# Patient Record
Sex: Male | Born: 1990 | Race: Black or African American | Hispanic: No | Marital: Married | Smoking: Never smoker
Health system: Southern US, Community
[De-identification: ages and names within clinical notes are randomized; demographics above are authoritative.]

## PROBLEM LIST (undated history)

## (undated) DIAGNOSIS — R569 Unspecified convulsions: Secondary | ICD-10-CM

## (undated) HISTORY — DX: Unspecified convulsions: R56.9

## (undated) HISTORY — PX: BRAIN SURGERY: SHX531

---
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
## 2014-12-20 ENCOUNTER — Ambulatory Visit (INDEPENDENT_AMBULATORY_CARE_PROVIDER_SITE_OTHER): Payer: Managed Care, Other (non HMO) | Admitting: Neurology

## 2014-12-20 ENCOUNTER — Telehealth: Payer: Self-pay | Admitting: *Deleted

## 2014-12-20 ENCOUNTER — Encounter: Payer: Self-pay | Admitting: Neurology

## 2014-12-20 VITALS — BP 138/87 | HR 68 | Temp 97.7°F | Ht 74.0 in | Wt 207.0 lb

## 2014-12-20 DIAGNOSIS — G40209 Localization-related (focal) (partial) symptomatic epilepsy and epileptic syndromes with complex partial seizures, not intractable, without status epilepticus: Secondary | ICD-10-CM | POA: Insufficient documentation

## 2014-12-20 DIAGNOSIS — I498 Other specified cardiac arrhythmias: Secondary | ICD-10-CM | POA: Diagnosis not present

## 2014-12-20 DIAGNOSIS — G40309 Generalized idiopathic epilepsy and epileptic syndromes, not intractable, without status epilepticus: Secondary | ICD-10-CM

## 2014-12-20 DIAGNOSIS — R569 Unspecified convulsions: Secondary | ICD-10-CM | POA: Diagnosis not present

## 2014-12-20 DIAGNOSIS — Z79899 Other long term (current) drug therapy: Secondary | ICD-10-CM | POA: Diagnosis not present

## 2014-12-20 DIAGNOSIS — IMO0002 Reserved for concepts with insufficient information to code with codable children: Secondary | ICD-10-CM | POA: Insufficient documentation

## 2014-12-20 DIAGNOSIS — G4452 New daily persistent headache (NDPH): Secondary | ICD-10-CM | POA: Diagnosis not present

## 2014-12-20 MED ORDER — LACOSAMIDE 150 MG PO TABS: 150.0000 mg | ORAL_TABLET | Freq: Two times a day (BID) | ORAL | Status: DC | PRN

## 2014-12-20 MED ORDER — AMITRIPTYLINE HCL 25 MG PO TABS: 25.0000 mg | ORAL_TABLET | Freq: Every day | ORAL | Status: DC

## 2014-12-20 NOTE — Patient Instructions (Addendum)
Overall you are doing fairly well but I do want to suggest a few things today:   Remember to drink plenty of fluid, eat healthy meals and do not skip any meals. Try to eat protein with a every meal and eat a healthy snack such as fruit or nuts in between meals. Try to keep a regular sleep-wake schedule and try to exercise daily, particularly in the form of walking, 20-30 minutes a day, if you can.   As far as your medications are concerned, I would like to suggest: Vimpat 150mg  twice daily, Amitriptyline 25mg  before bed  As far as diagnostic testing: MRI of the brain, EEG, labs, EKG before starting Amitriptyline  I would like to see you back in 4-6 months, sooner if we need to. Please call us with any interim questions, concerns, problems, updates or refill requests.   Please also call us for any test results so we can go over those with you on the phone.  My clinical assistant and will answer any of your questions and relay your messages to me and also relay most of my messages to you.   Our phone number is [phone]. We also have an after hours call service for urgent matters and there is a physician on-call for urgent questions. For any emergencies you know to call 911 or go to the nearest emergency room

## 2014-12-20 NOTE — Telephone Encounter (Signed)
Sent prescription to pt pharmacy at 12:05 pm.

## 2014-12-20 NOTE — Progress Notes (Signed)
[facility]    Provider:  Dr Lucia Gaskins Referring Provider: Maye Hides, PA Primary Care Physician:  Maye Hides, PA  CC:  Seizures  HPI:  Robert Byrd is a 24 y.o. male here as a referral from Dr. Hyacinth Meeker for seizure  Seizures started at age 7. Complex partial and GTCs. [facility] neurology as a child. Dr Hyacinth Meeker did an MRI of the brain last when he was [age]. Last EEG when he was 4. Seizures started with staring. He has an aura, starts to feel cold on the inside of his body, he tries to talk a lot, he "passes out". Mother provides most information. He starts salivating, starts moving his tongue around, he can't answer the simplest questions, he starts chewing a lot, staring off. Last GTCS was at age 55, denies missing dosages. He has been on tegretol all his life. Then they added Vimpat. Has had >3 events in the last month, woke up after urinating in the bed. Girlfriend said he had an episode of talking and saying strange things, afterwards is tired and has to sleep. Not missing doses. Previous to this, he has been controlled ok, maybe one seizure a month. Recently there was an error in his medications, he was on  vimpat bid and was changed to  bid and maybe that increased the seizures however was still having them once a month previously.   Also having daily pressure type headaches. Worsening. No weakness. No focal neurologic symptoms. Takes OTC medications daily.   Review of Systems: Patient complains of symptoms per HPI as well as the following symptoms: No CP, No SOB, No Palpitations, Headache, Seizures, No Depression. Pertinent negatives per HPI. All others negative.   History   Social History  . Marital Status: Single    Spouse Name: N/A  . Number of Children: 0  . Years of Education: Bachelor's   Occupational History  . [employer]    Social History Main Topics  . Smoking status: Never Smoker   . Smokeless tobacco: Not on file  . Alcohol  Use: No  . Drug Use: No  . Sexual Activity: Not on file   Other Topics Concern  . Not on file   Social History Narrative   Live at with home with girlfriend.   Right handed.   Caffeine use: Drinks soda occassionally     Family History  Problem Relation Age of Onset  . Hypertension Maternal Grandmother   . Diabetes Maternal Grandfather     Past Medical History  Diagnosis Date  . Seizure 1993    History reviewed. No pertinent past surgical history.  Current Outpatient Prescriptions  Medication Sig Dispense Refill  . albuterol (PROVENTIL HFA;VENTOLIN HFA) 108 (90 BASE) MCG/ACT inhaler Inhale 3 puffs into the lungs.    . carbamazepine (TEGRETOL XR) 200 MG 12 hr tablet Take 200 mg by mouth daily. 2 pill in the morning and 3 pills at night.    . Fluticasone-Salmeterol (ADVAIR DISKUS IN) Inhale 2 puffs into the lungs daily.    . Lacosamide 100 MG TABS Take 100 mg by mouth daily. 1 pill in the morning and 1 pill at night    . montelukast (SINGULAIR) 10 MG tablet Take 10 mg by mouth at bedtime.     No current facility-administered medications for this visit.    Allergies as of 12/20/2014  . (No Known Allergies)    Vitals: BP 138/87 mmHg  Pulse 68  Temp(Src) 97.7 F (36.5 C)  Ht 6\' 2"  (1.88 m)  Wt 207 lb (93.895 kg)  BMI 26.57 kg/m2 Last Weight:  Wt Readings from Last 1 Encounters:  12/20/14 207 lb (93.895 kg)   Last Height:   Ht Readings from Last 1 Encounters:  12/20/14 6\' 2"  (1.88 m)    Physical exam: Exam: Gen: NAD, conversant, well nourised, well groomed                     CV: RRR, no MRG. No Carotid Bruits. No peripheral edema, warm, nontender Eyes: Conjunctivae clear without exudates or hemorrhage  Neuro: Detailed Neurologic Exam  Speech:    Speech is normal; fluent and spontaneous with normal comprehension.  Cognition:    The patient is oriented to person, place, and time;     recent and remote memory intact;     language fluent;     normal  attention, concentration,     fund of knowledge Cranial Nerves:    The pupils are equal, round, and reactive to light. The fundi are normal and spontaneous venous pulsations are present. Visual fields are full to finger confrontation. Extraocular movements are intact. Trigeminal sensation is intact and the muscles of mastication are normal. The face is symmetric. The palate elevates in the midline. Hearing intact. Voice is normal. Shoulder shrug is normal. The tongue has normal motion without fasciculations.   Coordination:    Normal finger to nose and heel to shin. Normal rapid alternating movements.   Gait:    Heel-toe and tandem gait are normal.   Motor Observation:    No asymmetry, no atrophy, and no involuntary movements noted. Tone:    Normal muscle tone.    Posture:    Posture is normal. normal erect    Strength:    Strength is V/V in the upper and lower limbs.      Sensation: intact to LT     Reflex Exam:  DTR's:    Deep tendon reflexes in the upper and lower extremities are normal bilaterally.   Toes:    The toes are downgoing bilaterally.   Clonus:    Clonus is absent.      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 Daily tension type headaches with medication overuse. Neuro exam non focal. No MRi or eeg in 20+ years.  MRi of the brain w/wo contrast Labs today including levels EEG Start amitriptyline qhs EKG ordered Continue Tegretol Vimpat 150mg  bid  Naomie DeanAntonia Ericberto Padget, MD  [facility] [address] [place], [place] [id number]  Phone [phone] Fax [phone]

## 2014-12-21 ENCOUNTER — Telehealth: Payer: Self-pay | Admitting: *Deleted

## 2014-12-21 LAB — COMPREHENSIVE METABOLIC PANEL
A/G RATIO: 1.6 (ref 1.1–2.5)
ALT: 16 IU/L (ref 0–44)
AST: 19 IU/L (ref 0–40)
Albumin: 4.6 g/dL (ref 3.5–5.5)
Alkaline Phosphatase: 115 IU/L (ref 39–117)
BUN/Creatinine Ratio: 15 (ref 8–19)
BUN: 16 mg/dL (ref 6–20)
Bilirubin Total: <0.2 mg/dL (ref 0.0–1.2)
CALCIUM: 9.2 mg/dL (ref 8.7–10.2)
CO2: 26 mmol/L (ref 18–29)
CREATININE: 1.08 mg/dL (ref 0.76–1.27)
Chloride: 100 mmol/L (ref 97–108)
GFR calc Af Amer: 110 mL/min/{1.73_m2} (ref >59)
GFR, EST NON AFRICAN AMERICAN: 95 mL/min/{1.73_m2} (ref >59)
GLUCOSE: 85 mg/dL (ref 65–99)
Globulin, Total: 2.8 g/dL (ref 1.5–4.5)
Potassium: 4 mmol/L (ref 3.5–5.2)
SODIUM: 141 mmol/L (ref 134–144)
TOTAL PROTEIN: 7.4 g/dL (ref 6.0–8.5)

## 2014-12-21 LAB — CARBAMAZEPINE LEVEL, TOTAL

## 2014-12-21 LAB — VITAMIN D PNL(25-HYDRXY+1,25-DIHY)-BLD
VIT D 1 25 DIHYDROXY: 59.7 pg/mL (ref 19.9–79.3)
Vit D, 25-Hydroxy: 45.2 ng/mL (ref 30.0–100.0)

## 2014-12-21 LAB — B12 AND FOLATE PANEL
Folate: 6.9 ng/mL (ref >3.0)
VITAMIN B 12: 397 pg/mL (ref 211–946)

## 2014-12-21 NOTE — Telephone Encounter (Signed)
[facility] Rx for lacosamide (vimpat). She verbalized they received it and will be ready for pt to pick up today.

## 2014-12-22 LAB — CARBAMAZEPINE LEVEL, TOTAL: CARBAMAZEPINE LVL: 6.6 ug/mL (ref 4.0–12.0)

## 2014-12-22 LAB — LACOSAMIDE: LACOSAMIDE: 2.8 ug/mL — AB (ref 5.0–10.0)

## 2014-12-22 LAB — SPECIMEN STATUS REPORT

## 2014-12-25 ENCOUNTER — Telehealth: Payer: Self-pay | Admitting: *Deleted

## 2014-12-25 NOTE — Telephone Encounter (Signed)
Talked with patient about lab results per Dr. Lucia GaskinsAhern notes. Pt verbalized understanding.

## 2015-01-04 ENCOUNTER — Ambulatory Visit (INDEPENDENT_AMBULATORY_CARE_PROVIDER_SITE_OTHER): Payer: Managed Care, Other (non HMO)

## 2015-01-04 ENCOUNTER — Ambulatory Visit (INDEPENDENT_AMBULATORY_CARE_PROVIDER_SITE_OTHER): Payer: Managed Care, Other (non HMO) | Admitting: Neurology

## 2015-01-04 DIAGNOSIS — R569 Unspecified convulsions: Secondary | ICD-10-CM

## 2015-01-04 DIAGNOSIS — G4452 New daily persistent headache (NDPH): Secondary | ICD-10-CM | POA: Diagnosis not present

## 2015-01-04 MED ORDER — GADOPENTETATE DIMEGLUMINE 469.01 MG/ML IV SOLN: 20.0000 mL | Freq: Once | INTRAVENOUS | Status: AC | PRN

## 2015-01-04 NOTE — Procedures (Signed)
    History:  Robert Byrd is a 24 year old patient with a history of generalized tonic-clonic and complex partial seizures. The seizures have increased in frequency recently. The patient being evaluated for the ongoing seizures.  This is a routine EEG. No skull defects are noted. Medications include amitriptyline, carbamazepine, Vimpat, Advair, and Singulair.   EEG classification: Normal awake  Description of the recording: The background rhythms of this recording consists of a fairly well modulated medium amplitude alpha rhythm of 8 Hz that is reactive to eye opening and closure. As the record progresses, the patient appears to remain in the waking state throughout the recording. Photic stimulation was performed, resulting in a bilateral and symmetric photic driving response. Hyperventilation was also performed, resulting in a minimal buildup of the background rhythm activities without significant slowing seen. At no time during the recording does there appear to be evidence of spike or spike wave discharges or evidence of focal slowing. EKG monitor shows no evidence of cardiac rhythm abnormalities with a heart rate of 66.  Impression: This is a normal EEG recording in the waking state. No evidence of ictal or interictal discharges are seen.

## 2015-01-05 ENCOUNTER — Telehealth: Payer: Self-pay | Admitting: *Deleted

## 2015-01-05 NOTE — Telephone Encounter (Signed)
Talked with patient about normal EEG results. Patient verbalized understanding.

## 2015-01-11 ENCOUNTER — Telehealth: Payer: Self-pay | Admitting: Neurology

## 2015-01-11 NOTE — Telephone Encounter (Signed)
Called twice. No voice mail, sounded like something/someone picked up but no one on the line. 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

## 2015-01-11 NOTE — Telephone Encounter (Signed)
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

## 2015-01-17 ENCOUNTER — Telehealth: Payer: Self-pay | Admitting: *Deleted

## 2015-01-17 NOTE — Telephone Encounter (Signed)
Spoke with mother and made appointment for 01/25/15 at 11:15 am with pt and mother. Mother stated father cannot come because he will be working. I told her to arrive around 11:00am. Robert Byrd verbalized understanding.

## 2015-01-17 NOTE — Telephone Encounter (Signed)
Patient's mother is calling regarding the patient. His mother states she would like for her and patient's father to sit down and discuss patient's seizures. Please call and advise. Thank you.

## 2015-01-17 NOTE — Telephone Encounter (Signed)
Please schedule an appointment. Give me at least 30 minutes or make it before lunch or last appointment.

## 2015-01-25 ENCOUNTER — Ambulatory Visit: Payer: Self-pay | Admitting: Neurology

## 2015-02-21 ENCOUNTER — Other Ambulatory Visit: Payer: Self-pay | Admitting: Orthopaedic Surgery

## 2015-02-21 DIAGNOSIS — M47816 Spondylosis without myelopathy or radiculopathy, lumbar region: Secondary | ICD-10-CM

## 2015-02-23 ENCOUNTER — Ambulatory Visit
Admission: RE | Admit: 2015-02-23 | Discharge: 2015-02-23 | Disposition: A | Discharge status: Home / Self Care | Payer: 59 | Source: Ambulatory Visit | Attending: Orthopaedic Surgery | Admitting: Orthopaedic Surgery

## 2015-02-23 DIAGNOSIS — M47816 Spondylosis without myelopathy or radiculopathy, lumbar region: Secondary | ICD-10-CM

## 2015-03-15 ENCOUNTER — Emergency Department (HOSPITAL_BASED_OUTPATIENT_CLINIC_OR_DEPARTMENT_OTHER): Payer: Managed Care, Other (non HMO)

## 2015-03-15 ENCOUNTER — Encounter (HOSPITAL_BASED_OUTPATIENT_CLINIC_OR_DEPARTMENT_OTHER): Payer: Self-pay | Admitting: *Deleted

## 2015-03-15 ENCOUNTER — Emergency Department (HOSPITAL_BASED_OUTPATIENT_CLINIC_OR_DEPARTMENT_OTHER)
Admission: EM | Admit: 2015-03-15 | Discharge: 2015-03-15 | Disposition: A | Discharge status: Home / Self Care | Payer: Managed Care, Other (non HMO) | Attending: Emergency Medicine | Admitting: Emergency Medicine

## 2015-03-15 DIAGNOSIS — Z79899 Other long term (current) drug therapy: Secondary | ICD-10-CM | POA: Insufficient documentation

## 2015-03-15 DIAGNOSIS — Y998 Other external cause status: Secondary | ICD-10-CM | POA: Diagnosis not present

## 2015-03-15 DIAGNOSIS — G40909 Epilepsy, unspecified, not intractable, without status epilepticus: Secondary | ICD-10-CM | POA: Insufficient documentation

## 2015-03-15 DIAGNOSIS — S0990XA Unspecified injury of head, initial encounter: Secondary | ICD-10-CM

## 2015-03-15 DIAGNOSIS — W500XXA Accidental hit or strike by another person, initial encounter: Secondary | ICD-10-CM | POA: Diagnosis not present

## 2015-03-15 DIAGNOSIS — Y9367 Activity, basketball: Secondary | ICD-10-CM | POA: Insufficient documentation

## 2015-03-15 DIAGNOSIS — Y92310 Basketball court as the place of occurrence of the external cause: Secondary | ICD-10-CM | POA: Insufficient documentation

## 2015-03-15 MED ORDER — HYDROCODONE-ACETAMINOPHEN 5-325 MG PO TABS: 1.0000 | ORAL_TABLET | Freq: Four times a day (QID) | ORAL | Status: DC | PRN

## 2015-03-15 MED ORDER — HYDROCODONE-ACETAMINOPHEN 5-325 MG PO TABS
2.0000 | ORAL_TABLET | Freq: Once | ORAL | Status: AC
  Administered 2015-03-15: 2 via ORAL
  Filled 2015-03-15: qty 2

## 2015-03-15 NOTE — ED Notes (Signed)
States  Was elbowed side of head has a hx of seizures and thought he needed to be checked.  Alert and oriented color good skin warm and dry moves all extremeties denies loss of consciousness or other sx

## 2015-03-15 NOTE — ED Provider Notes (Signed)
CSN: [id number]     Arrival date & time 03/15/15  2019 History  This chart was scribed for Elwin Mocha, MD by Phillis Haggis, ED Scribe. This patient was seen in room MH01/MH01 and patient care was started at 8:41 PM.    Chief Complaint  Patient presents with  . Head Injury   Patient is a 24 y.o. male presenting with head injury. The history is provided by the patient. No language interpreter was used.  Head Injury Location:  R temporal Mechanism of injury: sports   Pain details:    Quality:  Sharp   Severity:  Moderate   Duration:  30 minutes   Timing:  Constant   Progression:  Worsening Chronicity:  New Ineffective treatments:  None tried Associated symptoms: headache   Associated symptoms: no blurred vision, no loss of consciousness, no nausea, no seizures and no vomiting     HPI Comments: Robert Byrd is a 24 y.o. male with a history of seizures who presents to the Emergency Department complaining of right temporal head injury onset 30 minutes ago. States that he was playing basketball when he was elbowed on the side of the head; reports some vision changes immediately after the incident but is not currently experiencing any. He denies nausea, vomiting, LOC, abnormal gait, dizziness, or any other other injuries. Mother states that in the past he has had seizures later on in the night after being hit in the head playing basketball. He reports that he is taking his seizure medications regularly and typically has one every six months since being regulated on medication by a neurologist in [place].  Past Medical History  Diagnosis Date  . Seizure 1993   History reviewed. No pertinent past surgical history. Family History  Problem Relation Age of Onset  . Hypertension Maternal Grandmother   . Diabetes Maternal Grandfather    History  Substance Use Topics  . Smoking status: Never Smoker   . Smokeless tobacco: Not on file  . Alcohol Use: No    Review of Systems  Eyes:  Negative for blurred vision.  Gastrointestinal: Negative for nausea and vomiting.  Musculoskeletal: Negative for gait problem.  Neurological: Positive for headaches. Negative for dizziness, seizures, loss of consciousness and syncope.  All other systems reviewed and are negative.  Allergies  Review of patient's allergies indicates no known allergies.  Home Medications   Prior to Admission medications   Medication Sig Start Date End Date Taking? Authorizing Provider  albuterol (PROVENTIL HFA;VENTOLIN HFA) 108 (90 BASE) MCG/ACT inhaler Inhale 3 puffs into the lungs.    Historical Provider, MD  amitriptyline (ELAVIL) 25 MG tablet Take 1 tablet (25 mg total) by mouth at bedtime. 12/20/14   Anson Fret, MD  carbamazepine (TEGRETOL XR) 200 MG 12 hr tablet Take 200 mg by mouth daily. 2 pill in the morning and 3 pills at night.    Historical Provider, MD  Fluticasone-Salmeterol (ADVAIR DISKUS IN) Inhale 2 puffs into the lungs daily.    Historical Provider, MD  Lacosamide 150 MG TABS Take 1 tablet (150 mg total) by mouth 2 (two) times daily as needed. 12/20/14   Anson Fret, MD  montelukast (SINGULAIR) 10 MG tablet Take 10 mg by mouth at bedtime.    Historical Provider, MD   BP 117/73 mmHg  Pulse 60  Temp(Src) 98.2 F (36.8 C) (Oral)  Resp 16  Ht  (1.88 m)  Wt 207 lb (93.895 kg)  BMI 26.57 kg/m2  SpO2 98% Physical  Exam  Constitutional: He is oriented to person, place, and time. He appears well-developed and well-nourished. No distress.  HENT:  Head: Normocephalic.    Mouth/Throat: No oropharyngeal exudate.  Eyes: EOM are normal. Pupils are equal, round, and reactive to light.  Neck: Normal range of motion. Neck supple.  Cardiovascular: Normal rate and regular rhythm.  Exam reveals no friction rub.   No murmur heard. Pulmonary/Chest: Effort normal and breath sounds normal. No respiratory distress. He has no wheezes. He has no rales.  Abdominal: He exhibits no distension. There  is no tenderness. There is no rebound.  Musculoskeletal: Normal range of motion. He exhibits no edema.  Neurological: He is alert and oriented to person, place, and time.  Skin: He is not diaphoretic.    ED Course  Procedures (including critical care time) Labs Review DIAGNOSTIC STUDIES: Oxygen Saturation is 98% on RA, normal by my interpretation.    COORDINATION OF CARE: 8:47 PM-Discussed treatment plan which includes CT scan and pain medication with pt at bedside and pt agreed to plan.   Labs Reviewed - No data to display  Imaging Review Ct Head Wo Contrast  03/15/2015   CLINICAL DATA:  Trauma, right sided head pain, headache  EXAM: CT HEAD WITHOUT CONTRAST  TECHNIQUE: Contiguous axial images were obtained from the base of the skull through the vertex without intravenous contrast.  COMPARISON:  MR brain 01/04/2015  FINDINGS: There is no evidence of mass effect, midline shift or extra-axial fluid collections. There is no evidence of a space-occupying lesion or intracranial hemorrhage. There is no evidence of a cortical-based area of acute infarction.  The ventricles and sulci are appropriate for the patient's age. The basal cisterns are patent.  Visualized portions of the orbits are unremarkable. The visualized portions of the paranasal sinuses and mastoid air cells are unremarkable.  The osseous structures are unremarkable.  IMPRESSION: Normal CT of the brain without intravenous contrast.   Electronically Signed   By: Elige KoHetal  Patel   On: 03/15/2015 21:11     EKG Interpretation None      MDM   Final diagnoses:  Head injury, initial encounter    24 year old male with history of seizures presents after a head injury will play basketball. He was elbowed in the right temple. History of seizures after head injuries previously. Did not have a seizure today. Neurologically intact here. No other injuries noted. No spinal tenderness. No extremity tenderness. We'll CT his head. CT normal.  Stable for discharge.  I personally performed the services described in this documentation, which was scribed in my presence. The recorded information has been reviewed and is accurate.     Elwin MochaBlair Jayana Kotula, MD 03/15/15 2120

## 2015-03-15 NOTE — ED Notes (Signed)
Elbowed in the right side of his head. No LOC. States he has a hx of seizures and felt he needed to be examined. He drove himself here.

## 2015-03-15 NOTE — Discharge Instructions (Signed)

## 2015-07-17 ENCOUNTER — Other Ambulatory Visit: Payer: Self-pay | Admitting: Neurology

## 2015-07-26 ENCOUNTER — Other Ambulatory Visit: Payer: Self-pay | Admitting: Neurology

## 2015-07-26 DIAGNOSIS — R569 Unspecified convulsions: Secondary | ICD-10-CM

## 2015-07-26 MED ORDER — LACOSAMIDE 150 MG PO TABS: 150.0000 mg | ORAL_TABLET | Freq: Two times a day (BID) | ORAL | Status: DC | PRN

## 2015-07-26 NOTE — Telephone Encounter (Signed)
[phone] called for refill request of Vimpat. Please fax to [phone]

## 2015-07-27 NOTE — Telephone Encounter (Signed)
Rx was signed and faxed   

## 2016-01-30 ENCOUNTER — Other Ambulatory Visit: Payer: Self-pay | Admitting: *Deleted

## 2016-01-30 ENCOUNTER — Encounter: Payer: Self-pay | Admitting: *Deleted

## 2016-01-30 DIAGNOSIS — R569 Unspecified convulsions: Secondary | ICD-10-CM

## 2016-01-30 MED ORDER — LACOSAMIDE 150 MG PO TABS: 150.0000 mg | ORAL_TABLET | Freq: Two times a day (BID) | ORAL | Status: DC | PRN

## 2016-01-30 NOTE — Progress Notes (Signed)
Faxed printed rx lacosamide to pt pharmacy for one month supply. Received confirmation. Pt has not been seen in over a year. He must schedule f/u. I wrote this on prescription sent to pharmacy. He cx f/u with Dr Lucia GaskinsAhern this month and stated he would call back to make another one at a later time.

## 2016-06-23 ENCOUNTER — Ambulatory Visit (INDEPENDENT_AMBULATORY_CARE_PROVIDER_SITE_OTHER): Payer: Managed Care, Other (non HMO) | Admitting: Nurse Practitioner

## 2016-06-23 ENCOUNTER — Encounter: Payer: Self-pay | Admitting: Nurse Practitioner

## 2016-06-23 ENCOUNTER — Telehealth: Payer: Self-pay | Admitting: Nurse Practitioner

## 2016-06-23 ENCOUNTER — Telehealth: Payer: Self-pay | Admitting: Neurology

## 2016-06-23 VITALS — BP 128/74 | HR 63 | Ht 74.0 in | Wt 197.4 lb

## 2016-06-23 DIAGNOSIS — G40909 Epilepsy, unspecified, not intractable, without status epilepticus: Secondary | ICD-10-CM | POA: Diagnosis not present

## 2016-06-23 DIAGNOSIS — G40209 Localization-related (focal) (partial) symptomatic epilepsy and epileptic syndromes with complex partial seizures, not intractable, without status epilepticus: Secondary | ICD-10-CM | POA: Diagnosis not present

## 2016-06-23 DIAGNOSIS — Z5181 Encounter for therapeutic drug level monitoring: Secondary | ICD-10-CM | POA: Diagnosis not present

## 2016-06-23 DIAGNOSIS — R569 Unspecified convulsions: Secondary | ICD-10-CM | POA: Diagnosis not present

## 2016-06-23 MED ORDER — LACOSAMIDE 150 MG PO TABS: 150.0000 mg | ORAL_TABLET | Freq: Two times a day (BID) | ORAL | 5 refills | Status: DC

## 2016-06-23 NOTE — Telephone Encounter (Signed)
Called and spoke to patient. He stated Andrey CampanileSandy C just called and spoke to him. Schedule f/u with CM.NP today at 1015 check in for 1030am appt. Needs refills on his medication.

## 2016-06-23 NOTE — Patient Instructions (Signed)
RX for Vimpat Get levels checked in 10 days Follow up in 6 months

## 2016-06-23 NOTE — Telephone Encounter (Signed)
error 

## 2016-06-23 NOTE — Telephone Encounter (Signed)
Pt requested appt for today.Advised he needs refill for vimpat. He said he thinks the dose needs to be increased. Dr A does not have availability until November. Pt last seen 4/16. Please call

## 2016-06-23 NOTE — Progress Notes (Signed)
[facility]  PATIENT: Robert Byrd DOB: 1991/04/24   REASON FOR VISIT: Seizure disorder HISTORY FROM: Patient    HISTORY OF PRESENT ILLNESS:UPDATE 10/09/2017CM [name], 25 year old male returns for follow-up he has a history of seizure disorder. He is currently on Tegretol 200 mg 2 in the morning and 3 at night along with Vimpat 150 twice daily. He ran out of his Vimpat Saturday morning and had a seizure Sunday  Afternoon while watching football. He has not missed doses of his Tegretol. He returns for reevaluation. He states he wants to have meds increased however the important thing is take the medication as directed and do not run out of medication. He returns for reevaluation   HISTORY AASeizures started at age 89. Complex partial and GTCs. [facility] Dr Hyacinth MeekerMiller did an MRI of the brain last when he was [age]. Last EEG when he was 4. Seizures started with staring. He has an aura, starts to feel cold on the inside of his body, he tries to talk a lot, he "passes out". Mother provides most information. He starts salivating, starts moving his tongue around, he can't answer the simplest questions, he starts chewing a lot, staring off. Last GTCS was at age 25, denies missing dosages. He has been on tegretol all his life. Then they added Vimpat. Has had >3 events in the last month, woke up after urinating in the bed. Girlfriend said he had an episode of talking and saying strange things, afterwards is tired and has to sleep. Not missing doses. Previous to this, he has been controlled ok, maybe one seizure a month. Recently there was an error in his medications, he was on 100mg  vimpat bid and was changed to 50mg  bid and maybe that increased the seizures however was still having them once a month previously.   Also having daily pressure type headaches. Worsening. No weakness. No focal neurologic symptoms. Takes OTC medications daily.   REVIEW OF SYSTEMS: Full 14  system review of systems performed and notable only for those listed, all others are neg:  Constitutional: neg  Cardiovascular: neg Ear/Nose/Throat: neg  Skin: neg Eyes: neg Respiratory: neg Gastroitestinal: neg  Hematology/Lymphatic: neg  Endocrine: neg Musculoskeletal:neg Allergy/Immunology: neg Neurological: Seizure disorder Psychiatric: neg Sleep : neg   ALLERGIES: No Known Allergies  HOME MEDICATIONS: Outpatient Medications Prior to Visit  Medication Sig Dispense Refill  . albuterol (PROVENTIL HFA;VENTOLIN HFA) 108 (90 BASE) MCG/ACT inhaler Inhale 3 puffs into the lungs.    Marland Kitchen. amitriptyline (ELAVIL) 25 MG tablet Take 1 tablet (25 mg total) by mouth at bedtime. 30 tablet 3  . carbamazepine (TEGRETOL XR) 200 MG 12 hr tablet Take 200 mg by mouth daily. 2 pill in the morning and 3 pills at night.    . Fluticasone-Salmeterol (ADVAIR DISKUS IN) Inhale 2 puffs into the lungs daily.    Marland Kitchen. HYDROcodone-acetaminophen (NORCO/VICODIN) 5-325 MG per tablet Take 1 tablet by mouth every 6 (six) hours as needed for moderate pain. 10 tablet 0  . Lacosamide 150 MG TABS Take 1 tablet (150 mg total) by mouth 2 (two) times daily as needed. (Patient taking differently: Take 150 mg by mouth 2 (two) times daily. ) 60 tablet 0  . montelukast (SINGULAIR) 10 MG tablet Take 10 mg by mouth at bedtime.     No facility-administered medications prior to visit.     PAST MEDICAL HISTORY: Past Medical History:  Diagnosis Date  . Seizure (HCC) 1993    PAST  SURGICAL HISTORY: History reviewed. No pertinent surgical history.  FAMILY HISTORY: Family History  Problem Relation Age of Onset  . Hypertension Maternal Grandmother   . Diabetes Maternal Grandfather     SOCIAL HISTORY: Social History   Social History  . Marital status: Single    Spouse name: N/A  . Number of children: 0  . Years of education: Bachelor's   Occupational History  . [employer]    Social History Main Topics  . Smoking  status: Never Smoker  . Smokeless tobacco: Never Used  . Alcohol use No  . Drug use: No  . Sexual activity: Not on file   Other Topics Concern  . Not on file   Social History Narrative   Live at with home with girlfriend.   Right handed.   Caffeine use: Drinks soda occassionally      PHYSICAL EXAM  Vitals:   06/23/16 1031  BP: 128/74  Pulse: 63  Weight: 197 lb 6.4 oz (89.5 kg)  Height: 6\' 2"  (1.88 m)   Body mass index is 25.34 kg/m.  Generalized: Well developed, in no acute distress  Head: normocephalic and atraumatic,. Oropharynx benign  Neck: Supple, no carotid bruits  Cardiac: Regular rate rhythm, no murmur  Musculoskeletal: No deformity   Neurological examination   Mentation: Alert oriented to time, place, history taking. Attention span and concentration appropriate. Recent and remote memory intact.  Follows all commands speech and language fluent.   Cranial nerve II-XII: Pupils were equal round reactive to light extraocular movements were full, visual field were full on confrontational test. Facial sensation and strength were normal. hearing was intact to finger rubbing bilaterally. Uvula tongue midline. head turning and shoulder shrug were normal and symmetric.Tongue protrusion into cheek strength was normal. Motor: normal bulk and tone, full strength in the BUE, BLE, fine finger movements normal, no pronator drift. No focal weakness Sensory: normal and symmetric to light touch,  Coordination: finger-nose-finger, heel-to-shin bilaterally, no dysmetria Reflexes: Brachioradialis 2/2, biceps 2/2, triceps 2/2, patellar 2/2, Achilles 2/2, plantar responses were flexor bilaterally. Gait and Station: Rising up from seated position without assistance, normal stance,  moderate stride, good arm swing, smooth turning, able to perform tiptoe, and heel walking without difficulty. Tandem gait is steady  DIAGNOSTIC DATA (LABS, IMAGING, TESTING) - I reviewed patient records, labs,  notes, testing and imaging myself where available.      Component Value Date/Time   NA 141 12/20/2014 1136   K 4.0 12/20/2014 1136   CL 100 12/20/2014 1136   CO2 26 12/20/2014 1136   GLUCOSE 85 12/20/2014 1136   BUN 16 12/20/2014 1136   CREATININE 1.08 12/20/2014 1136   CALCIUM 9.2 12/20/2014 1136   PROT 7.4 12/20/2014 1136   ALBUMIN 4.6 12/20/2014 1136   AST 19 12/20/2014 1136   ALT 16 12/20/2014 1136   ALKPHOS 115 12/20/2014 1136   BILITOT <0.2 12/20/2014 1136   GFRNONAA 95 12/20/2014 1136   GFRAA 110 12/20/2014 1136    ASSESSMENT AND PLAN  25 y.o. year old male  has a past medical history of Seizure (HCC) (1993). here to follow-up. He had a seizure on Sunday after missing Vimpat dose on Saturday because he ran out of medication. He continues to take his Tegretol.   PLAN: RX for Vimpat given do not run out of medication Continue Tegretol at current dose Get levels checked in 10 days for both Tegretol and Vimpat Follow up in 6 months Nilda Riggs, GNP, [facility],  APRN  [facility] [address], [place], [place] [address] [phone]

## 2016-07-03 ENCOUNTER — Ambulatory Visit: Payer: Managed Care, Other (non HMO) | Admitting: Nurse Practitioner

## 2016-07-08 ENCOUNTER — Other Ambulatory Visit (INDEPENDENT_AMBULATORY_CARE_PROVIDER_SITE_OTHER): Payer: Self-pay

## 2016-07-08 DIAGNOSIS — G40909 Epilepsy, unspecified, not intractable, without status epilepticus: Secondary | ICD-10-CM

## 2016-07-08 DIAGNOSIS — Z5181 Encounter for therapeutic drug level monitoring: Secondary | ICD-10-CM

## 2016-07-08 DIAGNOSIS — G40209 Localization-related (focal) (partial) symptomatic epilepsy and epileptic syndromes with complex partial seizures, not intractable, without status epilepticus: Secondary | ICD-10-CM

## 2016-07-08 DIAGNOSIS — Z0289 Encounter for other administrative examinations: Secondary | ICD-10-CM

## 2016-07-10 ENCOUNTER — Telehealth: Payer: Self-pay | Admitting: *Deleted

## 2016-07-10 LAB — CARBAMAZEPINE LEVEL, TOTAL: CARBAMAZEPINE LVL: 9 ug/mL (ref 4.0–12.0)

## 2016-07-10 LAB — LACOSAMIDE: LACOSAMIDE: 4.3 ug/mL — AB (ref 5.0–10.0)

## 2016-07-10 NOTE — Telephone Encounter (Signed)
Per Enid Skeens Martin, NP informed patient that his Bbood levels are good. Enid SkeensC Martin advises he continue taking the same doses as directed. The patient verbalized understanding, agreement.

## 2016-07-14 ENCOUNTER — Ambulatory Visit (INDEPENDENT_AMBULATORY_CARE_PROVIDER_SITE_OTHER): Payer: Self-pay | Admitting: Nurse Practitioner

## 2016-07-14 ENCOUNTER — Encounter: Payer: Self-pay | Admitting: Nurse Practitioner

## 2016-07-14 VITALS — BP 147/101 | HR 85 | Ht 74.0 in | Wt 197.0 lb

## 2016-07-14 DIAGNOSIS — G40209 Localization-related (focal) (partial) symptomatic epilepsy and epileptic syndromes with complex partial seizures, not intractable, without status epilepticus: Secondary | ICD-10-CM

## 2016-07-14 NOTE — Patient Instructions (Signed)
Multiple questions answered regarding epilepsy Continue medications as currently ordered Follow-up is planned

## 2016-07-14 NOTE — Progress Notes (Signed)
[facility]  PATIENT: Robert Byrd DOB: 27-Mar-1991   REASON FOR VISIT: Seizure disorder HISTORY FROM: Patient    HISTORY OF PRESENT ILLNESS: Patient is here with his mom and dad brother and wife with multiple questions about seizure disorder. UPDATE 10/09/2017CM Robert Byrd, 25 year old male returns for follow-up he has a history of seizure disorder. He is currently on Tegretol 200 mg 2 in the morning and 3 at night along with Vimpat 150 twice daily. He ran out of his Vimpat Saturday morning and had a seizure Sunday  Afternoon while watching football. He has not missed doses of his Tegretol. He returns for reevaluation. He states he wants to have meds increased however the important thing is take the medication as directed and do not run out of medication. He returns for reevaluation   HISTORY AASeizures started at age 89. Complex partial and GTCs. [facility] By[facility] neurology as a child. Dr Robert Byrd last when he was [age]. Last EEG when he was 4. Seizures started with staring. He has an aura, starts to feel cold on the inside of his body, he tries to talk a lot, he "passes out". Mother provides most information. He starts salivating, starts moving his tongue around, he can't answer the simplest questions, he starts chewing a lot, staring off. Last GTCS was at age 25, denies missing dosages. He has been on tegretol all his life. Then they added Vimpat. Has had >3 events in the last month, woke up after urinating in the bed. Girlfriend said he had an episode of talking and saying strange things, afterwards is tired and has to sleep. Not missing doses. Previous to this, he has been controlled ok, maybe one seizure a month. Recently there was an error in his medications, he was on 100mg  vimpat bid and was changed to 50mg  bid and maybe that increased the seizures however was still having them once a month previously.   Also having daily pressure type headaches. Worsening. No  weakness. No focal neurologic symptoms. Takes OTC medications daily.   REVIEW OF SYSTEMS: Full 14 system review of systems performed and notable only for those listed, all others are neg:  Constitutional: neg  Cardiovascular: neg Ear/Nose/Throat: neg  Skin: neg Eyes: neg Respiratory: neg Gastroitestinal: neg  Hematology/Lymphatic: neg  Endocrine: neg Musculoskeletal:neg Allergy/Immunology: neg Neurological: Seizure disorder Psychiatric: neg Sleep : neg   ALLERGIES: No Known Allergies  HOME MEDICATIONS: Outpatient Medications Prior to Visit  Medication Sig Dispense Refill  . albuterol (PROVENTIL HFA;VENTOLIN HFA) 108 (90 BASE) MCG/ACT inhaler Inhale 3 puffs into the lungs.    Marland Kitchen. amitriptyline (ELAVIL) 25 MG tablet Take 1 tablet (25 mg total) by mouth at bedtime. 30 tablet 3  . carbamazepine (TEGRETOL XR) 200 MG 12 hr tablet Take 200 mg by mouth daily. 2 pill in the morning and 3 pills at night.    . Fluticasone-Salmeterol (ADVAIR DISKUS IN) Inhale 2 puffs into the lungs daily.    Marland Kitchen. HYDROcodone-acetaminophen (NORCO/VICODIN) 5-325 MG per tablet Take 1 tablet by mouth every 6 (six) hours as needed for moderate pain. 10 tablet 0  . Lacosamide 150 MG TABS Take 1 tablet (150 mg total) by mouth 2 (two) times daily. 60 tablet 5  . montelukast (SINGULAIR) 10 MG tablet Take 10 mg by mouth at bedtime.     No facility-administered medications prior to visit.     PAST MEDICAL HISTORY: Past Medical History:  Diagnosis Date  . Seizure (HCC) 1993  PAST SURGICAL HISTORY: History reviewed. No pertinent surgical history.  FAMILY HISTORY: Family History  Problem Relation Age of Onset  . Hypertension Maternal Grandmother   . Diabetes Maternal Grandfather     SOCIAL HISTORY: Social History   Social History  . Marital status: Single    Spouse name: N/A  . Number of children: 0  . Years of education: Bachelor's   Occupational History  . [employer]    Social History Main  Topics  . Smoking status: Never Smoker  . Smokeless tobacco: Never Used  . Alcohol use No  . Drug use: No  . Sexual activity: Not on file   Other Topics Concern  . Not on file   Social History Narrative   Live at with home with girlfriend.   Right handed.   Caffeine use: Drinks soda occassionally      PHYSICAL EXAM  Vitals:   07/14/16 1526  BP: (!) 147/101  Pulse: 85  Weight: 197 lb (89.4 kg)  Height: 6\' 2"  (1.88 m)   Body mass index is 25.29 kg/m.   DIAGNOSTIC DATA (LABS, IMAGING, TESTING) - I reviewed patient records, labs, notes, testing and imaging myself where available.      Component Value Date/Time   NA 141 12/20/2014 1136   K 4.0 12/20/2014 1136   CL 100 12/20/2014 1136   CO2 26 12/20/2014 1136   GLUCOSE 85 12/20/2014 1136   BUN 16 12/20/2014 1136   CREATININE 1.08 12/20/2014 1136   CALCIUM 9.2 12/20/2014 1136   PROT 7.4 12/20/2014 1136   ALBUMIN 4.6 12/20/2014 1136   AST 19 12/20/2014 1136   ALT 16 12/20/2014 1136   ALKPHOS 115 12/20/2014 1136   BILITOT <0.2 12/20/2014 1136   GFRNONAA 95 12/20/2014 1136   GFRAA 110 12/20/2014 1136    ASSESSMENT AND PLAN  25 y.o. year old male  has a past medical history of Seizure (HCC) (1993). here to follow-up. He had a seizure on Sunday after missing Vimpat dose on Saturday because he ran out of medication. He continues to take his Tegretol.   PLAN: Continue Vimpat  do not run out of medication Continue Tegretol at current dose Patient's parents, brother and wife are here with numerous questions about seizure disorder.  Spent 30 minutes answering questions from each of them.   FollowAs already planned  Nilda RiggsNancy Carolyn Martin, [facility]NP, [facility], APRN  [facility] [address], [address] [place], [place] [address] [phone]

## 2016-07-17 NOTE — Progress Notes (Signed)
Personally have participated in and made any corrections needed to history, physical, neuro exam,assessment and plan as stated above.    Tadeusz Stahl, MD [facility] 

## 2016-12-22 ENCOUNTER — Encounter (INDEPENDENT_AMBULATORY_CARE_PROVIDER_SITE_OTHER): Payer: Self-pay

## 2016-12-22 ENCOUNTER — Encounter: Payer: Self-pay | Admitting: Nurse Practitioner

## 2016-12-22 ENCOUNTER — Ambulatory Visit (INDEPENDENT_AMBULATORY_CARE_PROVIDER_SITE_OTHER): Payer: BLUE CROSS/BLUE SHIELD | Admitting: Nurse Practitioner

## 2016-12-22 VITALS — BP 141/83 | HR 69 | Wt 209.0 lb

## 2016-12-22 DIAGNOSIS — G40209 Localization-related (focal) (partial) symptomatic epilepsy and epileptic syndromes with complex partial seizures, not intractable, without status epilepticus: Secondary | ICD-10-CM

## 2016-12-22 NOTE — Patient Instructions (Signed)
Continue  Vimpat  twice daily Continue Tegretol  2 in the am and 3 in the pm Check level of Tegretol and Vimpat CBC and CMP  Follow up yearly Call for any seizures

## 2016-12-22 NOTE — Progress Notes (Signed)
[facility]  PATIENT: Robert Byrd DOB: 09-08-1991   REASON FOR VISIT: Seizure disorder HISTORY FROM: Patient, wife    HISTORY OF PRESENT ILLNESS:UPDATE 04/09/2018CM Robert Byrd 26 year old male returns for follow-up of his wife. He has a history of seizure disorder starting at the age 89. He is currently on Tegretol and Vimpat without side effects. He denies missing any doses of his medication. In the past he has had seizures when missing doses of his medication. He returns for reevaluation. 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    UPDATE 10/09/2017CM Robert Byrd, 26 year old male returns for follow-up he has a history of seizure disorder. He is currently on Tegretol 200 mg 2 in the morning and 3 at night along with Vimpat 150 twice daily. He ran out of his Vimpat Saturday morning and had a seizure Sunday  Afternoon while watching football. He has not missed doses of his Tegretol. He returns for reevaluation. He states he wants to have meds increased however the important thing is take the medication as directed and do not run out of medication. He returns for reevaluation   HISTORY AASeizures started at age 65. Complex partial and GTCs. [facility] neurology as a child. Dr Hyacinth Meeker did an MRI of the brain last when he was [age]. Last EEG when he was 4. Seizures started with staring. He has an aura, starts to feel cold on the inside of his body, he tries to talk a lot, he "passes out". Mother provides most information. He starts salivating, starts moving his tongue around, he can't answer the simplest questions, he starts chewing a lot, staring off. Last GTCS was at age 5, denies missing dosages. He has been on tegretol all his life. Then they added Vimpat. Has had >3 events in the last month, woke up after urinating in the bed. Girlfriend said he had an episode of talking and saying strange things, afterwards is tired and has to sleep. Not missing doses. Previous to this, he  has been controlled ok, maybe one seizure a month. Recently there was an error in his medications, he was on  vimpat bid and was changed to  bid and maybe that increased the seizures however was still having them once a month previously.   Also having daily pressure type headaches. Worsening. No weakness. No focal neurologic symptoms. Takes OTC medications daily.   REVIEW OF SYSTEMS: Full 14 system review of systems performed and notable only for those listed, all others are neg:  Constitutional: neg  Cardiovascular: neg Ear/Nose/Throat: neg  Skin: neg Eyes: neg Respiratory: neg Gastroitestinal: neg  Hematology/Lymphatic: neg  Endocrine: neg Musculoskeletal:neg Allergy/Immunology: neg Neurological: Seizure disorder Psychiatric: neg Sleep : neg   ALLERGIES: No Known Allergies  HOME MEDICATIONS: Outpatient Medications Prior to Visit  Medication Sig Dispense Refill  . albuterol (PROVENTIL HFA;VENTOLIN HFA) 108 (90 BASE) MCG/ACT inhaler Inhale 3 puffs into the lungs.    Marland Kitchen amitriptyline (ELAVIL) 25 MG tablet Take 1 tablet (25 mg total) by mouth at bedtime. 30 tablet 3  . carbamazepine (TEGRETOL XR) 200 MG 12 hr tablet Take 200 mg by mouth daily. 2 pill in the morning and 3 pills at night.    . Fluticasone-Salmeterol (ADVAIR DISKUS IN) Inhale 2 puffs into the lungs daily.    Marland Kitchen HYDROcodone-acetaminophen (NORCO/VICODIN) 5-325 MG per tablet Take 1 tablet by mouth every 6 (six) hours as needed for moderate pain. 10 tablet 0  . Lacosamide 150 MG TABS Take 1 tablet (150  mg total) by mouth 2 (two) times daily. 60 tablet 5  . montelukast (SINGULAIR) 10 MG tablet Take 10 mg by mouth at bedtime.     No facility-administered medications prior to visit.     PAST MEDICAL HISTORY: Past Medical History:  Diagnosis Date  . Seizure (HCC) 1993    PAST SURGICAL HISTORY: History reviewed. No pertinent surgical history.  FAMILY HISTORY: Family History  Problem Relation Age of Onset    . Hypertension Maternal Grandmother   . Diabetes Maternal Grandfather     SOCIAL HISTORY: Social History   Social History  . Marital status: Married    Spouse name: Sue Lush  . Number of children: 1  . Years of education: Bachelor's   Occupational History  . [employer]    Social History Main Topics  . Smoking status: Never Smoker  . Smokeless tobacco: Never Used  . Alcohol use No  . Drug use: No  . Sexual activity: Not on file   Other Topics Concern  . Not on file   Social History Narrative   Live at with home with wife, child.   Right handed.   Caffeine use: Drinks soda occassionally      PHYSICAL EXAM  Vitals:   12/22/16 1544  BP: (!) 141/83  Pulse: 69  Weight: 209 lb (94.8 kg)   Body mass index is 26.83 kg/m.  Generalized: Well developed, in no acute distress  Head: normocephalic and atraumatic,. Oropharynx benign  Neck: Supple, no carotid bruits  Cardiac: Regular rate rhythm, no murmur  Musculoskeletal: No deformity   Neurological examination   Mentation: Alert oriented to time, place, history taking. Attention span and concentration appropriate. Recent and remote memory intact.  Follows all commands speech and language fluent.   Cranial nerve II-XII: Pupils were equal round reactive to light extraocular movements were full, visual field were full on confrontational test. Facial sensation and strength were normal. hearing was intact to finger rubbing bilaterally. Uvula tongue midline. head turning and shoulder shrug were normal and symmetric.Tongue protrusion into cheek strength was normal. Motor: normal bulk and tone, full strength in the BUE, BLE, fine finger movements normal, no pronator drift. No focal weakness Sensory: normal and symmetric to light touch,  Coordination: finger-nose-finger, heel-to-shin bilaterally, no dysmetria Reflexes: Symmetric upper and lower, plantar responses were flexor bilaterally. Gait and Station: Rising up from  seated position without assistance, normal stance,  moderate stride, good arm swing, smooth turning, able to perform tiptoe, and heel walking without difficulty. Tandem gait is steady  DIAGNOSTIC DATA (LABS, IMAGING, TESTING) - I reviewed patient records, labs, notes, testing and imaging myself where available.      Component Value Date/Time   NA 141 12/20/2014 1136   K 4.0 12/20/2014 1136   CL 100 12/20/2014 1136   CO2 26 12/20/2014 1136   GLUCOSE 85 12/20/2014 1136   BUN 16 12/20/2014 1136   CREATININE 1.08 12/20/2014 1136   CALCIUM 9.2 12/20/2014 1136   PROT 7.4 12/20/2014 1136   ALBUMIN 4.6 12/20/2014 1136   AST 19 12/20/2014 1136   ALT 16 12/20/2014 1136   ALKPHOS 115 12/20/2014 1136   BILITOT <0.2 12/20/2014 1136   GFRNONAA 95 12/20/2014 1136   GFRAA 110 12/20/2014 1136    ASSESSMENT AND PLAN  26 y.o. year old male  has a past medical history of Seizure (HCC) (1993). here to follow-up. He has not had any recent seizure activity, last seizure was after missing doses of his Vimpat.Marland Kitchen  PLAN: Continue  Vimpat  twice daily Continue Tegretol  2 in the am and 3 in the pm Check level of Tegretol and Vimpat to monitor for therapeutic level CBC and CMP to monitor for adverse effects of seizure meds Follow up yearly Call for any seizures Nilda Riggs, [facility], APRN  [facility] [address] [place], [place] [address] [phone]

## 2016-12-24 ENCOUNTER — Telehealth: Payer: Self-pay | Admitting: *Deleted

## 2016-12-24 ENCOUNTER — Other Ambulatory Visit: Payer: Self-pay | Admitting: Nurse Practitioner

## 2016-12-24 DIAGNOSIS — R569 Unspecified convulsions: Secondary | ICD-10-CM

## 2016-12-24 LAB — CBC WITH DIFFERENTIAL/PLATELET
BASOS: 0 %
Basophils Absolute: 0 10*3/uL (ref 0.0–0.2)
EOS (ABSOLUTE): 0.2 10*3/uL (ref 0.0–0.4)
EOS: 6 %
Hematocrit: 38.9 % (ref 37.5–51.0)
Hemoglobin: 13.4 g/dL (ref 13.0–17.7)
IMMATURE GRANS (ABS): 0 10*3/uL (ref 0.0–0.1)
IMMATURE GRANULOCYTES: 0 %
Lymphocytes Absolute: 1.7 10*3/uL (ref 0.7–3.1)
Lymphs: 60 %
MCH: 29.3 pg (ref 26.6–33.0)
MCHC: 34.4 g/dL (ref 31.5–35.7)
MCV: 85 fL (ref 79–97)
MONOCYTES: 7 %
Monocytes Absolute: 0.2 10*3/uL (ref 0.1–0.9)
NEUTROS PCT: 27 %
Neutrophils Absolute: 0.8 10*3/uL — ABNORMAL LOW (ref 1.4–7.0)
Platelets: 246 10*3/uL (ref 150–379)
RBC: 4.58 x10E6/uL (ref 4.14–5.80)
RDW: 13.9 % (ref 12.3–15.4)
WBC: 2.8 10*3/uL — ABNORMAL LOW (ref 3.4–10.8)

## 2016-12-24 LAB — COMPREHENSIVE METABOLIC PANEL
ALT: 17 IU/L (ref 0–44)
AST: 16 IU/L (ref 0–40)
Albumin/Globulin Ratio: 1.6 (ref 1.2–2.2)
Albumin: 4.7 g/dL (ref 3.5–5.5)
Alkaline Phosphatase: 87 IU/L (ref 39–117)
BUN/Creatinine Ratio: 12 (ref 9–20)
BUN: 12 mg/dL (ref 6–20)
Bilirubin Total: <0.2 mg/dL (ref 0.0–1.2)
CO2: 27 mmol/L (ref 18–29)
CREATININE: 0.99 mg/dL (ref 0.76–1.27)
Calcium: 9.1 mg/dL (ref 8.7–10.2)
Chloride: 99 mmol/L (ref 96–106)
GFR calc non Af Amer: 104 mL/min/{1.73_m2} (ref >59)
GFR, EST AFRICAN AMERICAN: 120 mL/min/{1.73_m2} (ref >59)
Globulin, Total: 2.9 g/dL (ref 1.5–4.5)
Glucose: 82 mg/dL (ref 65–99)
Potassium: 3.8 mmol/L (ref 3.5–5.2)
Sodium: 142 mmol/L (ref 134–144)
Total Protein: 7.6 g/dL (ref 6.0–8.5)

## 2016-12-24 LAB — LACOSAMIDE: Lacosamide: 4.7 ug/mL — ABNORMAL LOW (ref 5.0–10.0)

## 2016-12-24 LAB — CARBAMAZEPINE LEVEL, TOTAL: CARBAMAZEPINE LVL: 10.2 ug/mL (ref 4.0–12.0)

## 2016-12-24 MED ORDER — CARBAMAZEPINE ER 200 MG PO TB12: ORAL_TABLET | ORAL | 11 refills | Status: DC

## 2016-12-24 MED ORDER — LACOSAMIDE 150 MG PO TABS: 150.0000 mg | ORAL_TABLET | Freq: Two times a day (BID) | ORAL | 5 refills | Status: DC

## 2016-12-24 NOTE — Telephone Encounter (Signed)
Per C Martin, LVM informing patient that  his lab levels of Carbamazepine and Lacosamide are good. Advised his WBC are little low. Advised Eber Jones will repeat his lab in 1 month, no appointment necessary. Left number for any questions.

## 2017-01-22 NOTE — Progress Notes (Signed)
Personally  participated in, made any corrections needed, and agree with history, physical, neuro exam,assessment and plan as stated above.    Antonia Ahern, MD [facility] 

## 2017-01-30 ENCOUNTER — Telehealth: Payer: Self-pay | Admitting: *Deleted

## 2017-01-30 NOTE — Telephone Encounter (Signed)
LVM reminding patient that Enid Skeens Martin, NP wants him to get repeat CBC, was due 01/23/17. Gave him office lab hours, advised he will not need an appointment, but tell front desk he is for lab only. Left number for questions.

## 2017-06-15 ENCOUNTER — Other Ambulatory Visit (INDEPENDENT_AMBULATORY_CARE_PROVIDER_SITE_OTHER): Payer: Self-pay

## 2017-06-15 ENCOUNTER — Other Ambulatory Visit: Payer: Self-pay | Admitting: Nurse Practitioner

## 2017-06-15 DIAGNOSIS — G40209 Localization-related (focal) (partial) symptomatic epilepsy and epileptic syndromes with complex partial seizures, not intractable, without status epilepticus: Secondary | ICD-10-CM

## 2017-06-15 DIAGNOSIS — Z5181 Encounter for therapeutic drug level monitoring: Secondary | ICD-10-CM | POA: Insufficient documentation

## 2017-06-15 DIAGNOSIS — Z0289 Encounter for other administrative examinations: Secondary | ICD-10-CM

## 2017-06-16 LAB — CBC WITH DIFFERENTIAL/PLATELET
Basophils Absolute: 0 10*3/uL (ref 0.0–0.2)
Basos: 0 %
EOS (ABSOLUTE): 0.1 10*3/uL (ref 0.0–0.4)
Eos: 4 %
HEMATOCRIT: 38.1 % (ref 37.5–51.0)
Hemoglobin: 13.7 g/dL (ref 13.0–17.7)
Immature Grans (Abs): 0 10*3/uL (ref 0.0–0.1)
Immature Granulocytes: 0 %
LYMPHS ABS: 2 10*3/uL (ref 0.7–3.1)
LYMPHS: 70 %
MCH: 30 pg (ref 26.6–33.0)
MCHC: 36 g/dL — ABNORMAL HIGH (ref 31.5–35.7)
MCV: 84 fL (ref 79–97)
Monocytes Absolute: 0.2 10*3/uL (ref 0.1–0.9)
Monocytes: 5 %
NEUTROS ABS: 0.6 10*3/uL — AB (ref 1.4–7.0)
Neutrophils: 21 %
Platelets: 246 10*3/uL (ref 150–379)
RBC: 4.56 x10E6/uL (ref 4.14–5.80)
RDW: 12.8 % (ref 12.3–15.4)
WBC: 2.8 10*3/uL — ABNORMAL LOW (ref 3.4–10.8)

## 2017-06-18 ENCOUNTER — Telehealth: Payer: Self-pay | Admitting: *Deleted

## 2017-06-18 NOTE — Telephone Encounter (Signed)
Fax confirmation received of lab results to Dallie Dad, [phone].

## 2017-06-18 NOTE — Telephone Encounter (Signed)
Spoke to pt and let him know that his white count is stable.   Will send copy to his pcp.  He verbalized understanding.

## 2017-06-18 NOTE — Telephone Encounter (Signed)
-----   Message from Nilda Riggs, NP sent at 06/16/2017  1:51 PM EDT ----- Cliffton Asters count stable please send copy to primary care and call the pt

## 2017-08-05 ENCOUNTER — Other Ambulatory Visit: Payer: Self-pay | Admitting: Nurse Practitioner

## 2017-08-05 DIAGNOSIS — R569 Unspecified convulsions: Secondary | ICD-10-CM

## 2017-08-05 MED ORDER — LACOSAMIDE 150 MG PO TABS: 150.0000 mg | ORAL_TABLET | Freq: Two times a day (BID) | ORAL | 5 refills | Status: DC

## 2017-08-05 NOTE — Telephone Encounter (Signed)
Pt calling re: his prescription for Vimpat.  Pt states when he last saw NP Eber Jonesarolyn in April he said he was told by her that she would fill it be he never heard anything about it.  Pt is now asking if [facility] can [facility] the [facility] [address] [facility] [place], [place] - [address] [phone] (Phone) [phone] (Fax)   Pt asking to be called if this can not be filled

## 2017-08-05 NOTE — Telephone Encounter (Signed)
Refilled for 6 months, this is controlled medication, and is only prescribed for 6 months at a time.  Prescription in your inbox for signature.

## 2017-08-05 NOTE — Telephone Encounter (Signed)
Done, fax confirmation received [facility] [phone].(generic vimpat).

## 2017-08-05 NOTE — Addendum Note (Signed)
Addended by: Guy BeginYOUNG, Nayomi Tabron S on: 08/05/2017 10:03 AM   Modules accepted: Orders

## 2017-12-23 NOTE — Progress Notes (Signed)
[facility]  PATIENT: Robert Byrd DOB: 02-28-1991   REASON FOR VISIT: Seizure disorder HISTORY FROM: Patient, wife    HISTORY OF PRESENT ILLNESS:UPDATE 4/11/19CM Robert Byrd, 27 year old male returns for follow-up with history of seizure disorder.  He reports that he has had 4-5 seizures in the last year.  He has not kept a diary, one recently when he was in the car with his wife when  she was driving he has missed some doses of his medication.  He also reports that he was on narcotics for back pain several months ago.  He has had therapeutic levels of his medication in the past.  He is currently taking Vimpat 150 twice daily and Tegretol 200 XR 3 in the morning and 2 at night.  He returns for reevaluation.  He knows he cannot drive for 6 months   UPDATE 04/09/2018CM Robert Byrd 27 year old male returns for follow-up of his wife. He has a history of seizure disorder starting at the age 34. He is currently on Tegretol and Vimpat without side effects. He denies missing any doses of his medication. In the past he has had seizures when missing doses of his medication. He returns for reevaluation. 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    UPDATE 10/09/2017CM Robert Byrd, 27 year old male returns for follow-up he has a history of seizure disorder. He is currently on Tegretol 200 mg 2 in the morning and 3 at night along with Vimpat 150 twice daily. He ran out of his Vimpat Saturday morning and had a seizure Sunday  Afternoon while watching football. He has not missed doses of his Tegretol. He returns for reevaluation. He states he wants to have meds increased however the important thing is take the medication as directed and do not run out of medication. He returns for reevaluation   HISTORY AASeizures started at age 37. Complex partial and GTCs. [facility] neurology as a child. Dr Hyacinth Meeker did an MRI of the brain last when he was [age]. Last EEG when he was 4. Seizures started with  staring. He has an aura, starts to feel cold on the inside of his body, he tries to talk a lot, he "passes out". Mother provides most information. He starts salivating, starts moving his tongue around, he can't answer the simplest questions, he starts chewing a lot, staring off. Last GTCS was at age 37, denies missing dosages. He has been on tegretol all his life. Then they added Vimpat. Has had >3 events in the last month, woke up after urinating in the bed. Girlfriend said he had an episode of talking and saying strange things, afterwards is tired and has to sleep. Not missing doses. Previous to this, he has been controlled ok, maybe one seizure a month. Recently there was an error in his medications, he was on 100mg  vimpat bid and was changed to 50mg  bid and maybe that increased the seizures however was still having them once a month previously.   Also having daily pressure type headaches. Worsening. No weakness. No focal neurologic symptoms. Takes OTC medications daily.   REVIEW OF SYSTEMS: Full 14 system review of systems performed and notable only for those listed, all others are neg:  Constitutional: neg  Cardiovascular: neg Ear/Nose/Throat: neg  Skin: neg Eyes: neg Respiratory: neg Gastroitestinal: neg  Hematology/Lymphatic: neg  Endocrine: neg Musculoskeletal:neg Allergy/Immunology: neg Neurological: Seizure disorder Psychiatric: neg Sleep : neg   ALLERGIES: No Known Allergies  HOME MEDICATIONS: Outpatient Medications Prior to Visit  Medication Sig Dispense Refill  . albuterol (PROVENTIL HFA;VENTOLIN HFA) 108 (90 BASE) MCG/ACT inhaler Inhale 3 puffs into the lungs.    Marland Kitchen amitriptyline (ELAVIL) 25 MG tablet Take 1 tablet (25 mg total) by mouth at bedtime. 30 tablet 3  . carbamazepine (TEGRETOL XR) 200 MG 12 hr tablet 2 pill in the morning and 3 pills at night. 150 tablet 11  . Fluticasone-Salmeterol (ADVAIR DISKUS IN) Inhale 2 puffs into the lungs daily.    Marland Kitchen  HYDROcodone-acetaminophen (NORCO/VICODIN) 5-325 MG per tablet Take 1 tablet by mouth every 6 (six) hours as needed for moderate pain. 10 tablet 0  . Lacosamide 150 MG TABS Take 1 tablet (150 mg total) by mouth 2 (two) times daily. 60 tablet 5  . montelukast (SINGULAIR) 10 MG tablet Take 10 mg by mouth at bedtime.     No facility-administered medications prior to visit.     PAST MEDICAL HISTORY: Past Medical History:  Diagnosis Date  . Seizure (HCC) 1993    PAST SURGICAL HISTORY: History reviewed. No pertinent surgical history.  FAMILY HISTORY: Family History  Problem Relation Age of Onset  . Hypertension Maternal Grandmother   . Diabetes Maternal Grandfather     SOCIAL HISTORY: Social History   Socioeconomic History  . Marital status: Married    Spouse name: Sue Lush  . Number of children: 1  . Years of education: Bachelor's  . Highest education level: Not on file  Occupational History  . Occupation: [employer]  Social Needs  . Financial resource strain: Not on file  . Food insecurity:    Worry: Not on file    Inability: Not on file  . Transportation needs:    Medical: Not on file    Non-medical: Not on file  Tobacco Use  . Smoking status: Never Smoker  . Smokeless tobacco: Never Used  Substance and Sexual Activity  . Alcohol use: No    Alcohol/week: 0.0 oz  . Drug use: No  . Sexual activity: Not on file  Lifestyle  . Physical activity:    Days per week: Not on file    Minutes per session: Not on file  . Stress: Not on file  Relationships  . Social connections:    Talks on phone: Not on file    Gets together: Not on file    Attends religious service: Not on file    Active member of club or organization: Not on file    Attends meetings of clubs or organizations: Not on file    Relationship status: Not on file  . Intimate partner violence:    Fear of current or ex partner: Not on file    Emotionally abused: Not on file    Physically abused: Not on  file    Forced sexual activity: Not on file  Other Topics Concern  . Not on file  Social History Narrative   Live at with home with wife, child.   Right handed.   Caffeine use: Drinks soda occassionally      PHYSICAL EXAM  Vitals:   12/24/17 1517  BP: 138/86  Pulse: 73  Weight: 206 lb 6.4 oz (93.6 kg)  Height: 6\' 2"  (1.88 m)   Body mass index is 26.5 kg/m.  Generalized: Well developed, in no acute distress  Head: normocephalic and atraumatic,. Oropharynx benign  Neck: Supple,  Musculoskeletal: No deformity   Neurological examination   Mentation: Alert oriented to time, place, history taking. Attention span and concentration appropriate. Recent and  remote memory intact.  Follows all commands speech and language fluent.   Cranial nerve II-XII: Pupils were equal round reactive to light extraocular movements were full, visual field were full on confrontational test. Facial sensation and strength were normal. hearing was intact to finger rubbing bilaterally. Uvula tongue midline. head turning and shoulder shrug were normal and symmetric.Tongue protrusion into cheek strength was normal. Motor: normal bulk and tone, full strength in the BUE, BLE, fine finger movements normal, no pronator drift. No focal weakness Sensory: normal and symmetric to light touch,  Coordination: finger-nose-finger, heel-to-shin bilaterally, no dysmetria Reflexes: Symmetric upper and lower, plantar responses were flexor bilaterally. Gait and Station: Rising up from seated position without assistance, normal stance,  moderate stride, good arm swing, smooth turning, able to perform tiptoe, and heel walking without difficulty. Tandem gait is steady  DIAGNOSTIC DATA (LABS, IMAGING, TESTING) - I reviewed patient records, labs, notes, testing and imaging myself where available.      Component Value Date/Time   NA 142 12/22/2016 1622   K 3.8 12/22/2016 1622   CL 99 12/22/2016 1622   CO2 27 12/22/2016 1622     GLUCOSE 82 12/22/2016 1622   BUN 12 12/22/2016 1622   CREATININE 0.99 12/22/2016 1622   CALCIUM 9.1 12/22/2016 1622   PROT 7.6 12/22/2016 1622   ALBUMIN 4.7 12/22/2016 1622   AST 16 12/22/2016 1622   ALT 17 12/22/2016 1622   ALKPHOS 87 12/22/2016 1622   BILITOT <0.2 12/22/2016 1622   GFRNONAA 104 12/22/2016 1622   GFRAA 120 12/22/2016 1622    ASSESSMENT AND PLAN  27 y.o. year old male  has a past medical history of Seizure (HCC) (1993). here to follow-up.  He claims he has had for the past seizures in the last year he has not kept a diary he has missed several doses of his medication.  He also was on narcotics several months ago for back pain.  He returns for reevaluation   PLAN: Continue  Vimpat 150mg  twice dailyfor now  Continue Tegretol 200mg  2 in the am and 3 in the pm Check level of Tegretol and Vimpat to monitor for therapeutic level then adjust meds if needed CBC and CMP to monitor for adverse effects of seizure meds Follow up 6 months Please remember, common seizure triggers are: Sleep deprivation, dehydration, overheating, stress, hypoglycemia or skipping meals, certain medications or excessive alcohol use, especially stopping alcohol abruptly if you have had heavy alcohol use before (aka alcohol withdrawal seizure). If you have a prolonged seizure over 2-5 minutes or back to back seizures, call or have someone call 911 or take you to the nearest emergency room. You cannot drive a car or operate any other machinery or vehicle within 6 months of a seizure. Please do not swim alone  Take your medicine for seizure prevention regularly and do not skip doses or stop medication abruptly and tone are told to do so by your healthcare provider.  Avoid taking Wellbutrin, narcotic pain medications and tramadol, as they can lower seizure threshold.  I spent 25 minutes in total face to face time with the patient more than 50% of which was spent counseling and coordination of care, reviewing  test results reviewing medications and discussing and reviewing the diagnosis of seizure and discussing seizure  triggers . Nilda RiggsNancy Carolyn Martin, [facility] CenterGNP, [facility] CenterBC, APRN  [facility] [address], [address] [place], [place] [address] [phone]

## 2017-12-24 ENCOUNTER — Encounter: Payer: Self-pay | Admitting: Nurse Practitioner

## 2017-12-24 ENCOUNTER — Ambulatory Visit: Payer: BLUE CROSS/BLUE SHIELD | Admitting: Nurse Practitioner

## 2017-12-24 VITALS — BP 138/86 | HR 73 | Ht 74.0 in | Wt 206.4 lb

## 2017-12-24 DIAGNOSIS — Z5181 Encounter for therapeutic drug level monitoring: Secondary | ICD-10-CM

## 2017-12-24 DIAGNOSIS — G40209 Localization-related (focal) (partial) symptomatic epilepsy and epileptic syndromes with complex partial seizures, not intractable, without status epilepticus: Secondary | ICD-10-CM | POA: Diagnosis not present

## 2017-12-24 NOTE — Patient Instructions (Signed)
Continue  Vimpat 150mg  twice dailyfor now  Continue Tegretol 200mg  2 in the am and 3 in the pm Check level of Tegretol and Vimpat to monitor for therapeutic level then adjust meds if needed CBC and CMP to monitor for adverse effects of seizure meds Follow up 6 months Please remember, common seizure triggers are: Sleep deprivation, dehydration, overheating, stress, hypoglycemia or skipping meals, certain medications or excessive alcohol use, especially stopping alcohol abruptly if you have had heavy alcohol use before (aka alcohol withdrawal seizure). If you have a prolonged seizure over 2-5 minutes or back to back seizures, call or have someone call 911 or take you to the nearest emergency room. You cannot drive a car or operate any other machinery or vehicle within 6 months of a seizure. Please do not swim alone  Take your medicine for seizure prevention regularly and do not skip doses or stop medication abruptly and tone are told to do so by your healthcare provider.  Avoid taking Wellbutrin, narcotic pain medications and tramadol, as they can lower seizure threshold.

## 2017-12-26 LAB — CBC WITH DIFFERENTIAL/PLATELET
BASOS ABS: 0 10*3/uL (ref 0.0–0.2)
Basos: 0 %
EOS (ABSOLUTE): 0.1 10*3/uL (ref 0.0–0.4)
Eos: 5 %
Hematocrit: 41.3 % (ref 37.5–51.0)
Hemoglobin: 14.2 g/dL (ref 13.0–17.7)
Immature Grans (Abs): 0 10*3/uL (ref 0.0–0.1)
Immature Granulocytes: 0 %
Lymphocytes Absolute: 1.6 10*3/uL (ref 0.7–3.1)
Lymphs: 63 %
MCH: 29.9 pg (ref 26.6–33.0)
MCHC: 34.4 g/dL (ref 31.5–35.7)
MCV: 87 fL (ref 79–97)
Monocytes Absolute: 0.2 10*3/uL (ref 0.1–0.9)
Monocytes: 6 %
NEUTROS ABS: 0.7 10*3/uL — AB (ref 1.4–7.0)
NEUTROS PCT: 26 %
Platelets: 240 10*3/uL (ref 150–379)
RBC: 4.75 x10E6/uL (ref 4.14–5.80)
RDW: 13.5 % (ref 12.3–15.4)
WBC: 2.6 10*3/uL — ABNORMAL LOW (ref 3.4–10.8)

## 2017-12-26 LAB — COMPREHENSIVE METABOLIC PANEL
A/G RATIO: 1.6 (ref 1.2–2.2)
ALT: 28 IU/L (ref 0–44)
AST: 20 IU/L (ref 0–40)
Albumin: 4.8 g/dL (ref 3.5–5.5)
Alkaline Phosphatase: 84 IU/L (ref 39–117)
BUN/Creatinine Ratio: 12 (ref 9–20)
BUN: 14 mg/dL (ref 6–20)
CHLORIDE: 101 mmol/L (ref 96–106)
CO2: 26 mmol/L (ref 20–29)
Calcium: 9.5 mg/dL (ref 8.7–10.2)
Creatinine, Ser: 1.13 mg/dL (ref 0.76–1.27)
GFR calc non Af Amer: 88 mL/min/{1.73_m2} (ref >59)
GFR, EST AFRICAN AMERICAN: 102 mL/min/{1.73_m2} (ref >59)
Globulin, Total: 3 g/dL (ref 1.5–4.5)
Glucose: 90 mg/dL (ref 65–99)
POTASSIUM: 4.2 mmol/L (ref 3.5–5.2)
Sodium: 142 mmol/L (ref 134–144)
Total Protein: 7.8 g/dL (ref 6.0–8.5)

## 2017-12-26 LAB — CARBAMAZEPINE LEVEL, TOTAL: CARBAMAZEPINE LVL: 7.4 ug/mL (ref 4.0–12.0)

## 2017-12-26 LAB — LACOSAMIDE: Lacosamide: 3.4 ug/mL — ABNORMAL LOW (ref 5.0–10.0)

## 2017-12-28 ENCOUNTER — Telehealth: Payer: Self-pay | Admitting: Nurse Practitioner

## 2017-12-28 DIAGNOSIS — R569 Unspecified convulsions: Secondary | ICD-10-CM

## 2017-12-28 MED ORDER — LACOSAMIDE 150 MG PO TABS: 150.0000 mg | ORAL_TABLET | Freq: Two times a day (BID) | ORAL | 5 refills | Status: DC

## 2017-12-28 MED ORDER — CARBAMAZEPINE ER 200 MG PO TB12: ORAL_TABLET | ORAL | 11 refills | Status: DC

## 2017-12-28 NOTE — Telephone Encounter (Signed)
Spoke with patient and informed him that his Tegretol level is down to 7.4 from 10.  Advised him Eber JonesCarolyn is increasing it to 3 tablets twice daily.  She will continue Vimpat at current dose. Advised him both of these have been called in to his pharmacy. Reminded him to pay close attention to new directions on Tegretol. He verbalized understanding, had no questions.

## 2017-12-28 NOTE — Telephone Encounter (Signed)
Tegretol level down to 7.4 from 10.  Increase to 3 tablets twice daily.  Continue Vimpat at current dose.  Both of these have been called in.  Please call the patient

## 2017-12-29 NOTE — Telephone Encounter (Signed)
Fax confirmation received for lacosamide [facility] [phone]. [phone]

## 2017-12-29 NOTE — Progress Notes (Signed)
Personally  participated in, made any corrections needed, and agree with history, physical, neuro exam,assessment and plan as stated above.    Antonia Ahern, MD [facility] 

## 2018-01-31 ENCOUNTER — Encounter (HOSPITAL_COMMUNITY): Payer: Self-pay | Admitting: Emergency Medicine

## 2018-01-31 ENCOUNTER — Emergency Department (HOSPITAL_COMMUNITY)
Admission: EM | Admit: 2018-01-31 | Discharge: 2018-01-31 | Disposition: A | Discharge status: Home / Self Care | Payer: BLUE CROSS/BLUE SHIELD | Attending: Emergency Medicine | Admitting: Emergency Medicine

## 2018-01-31 ENCOUNTER — Emergency Department (HOSPITAL_COMMUNITY): Payer: BLUE CROSS/BLUE SHIELD

## 2018-01-31 DIAGNOSIS — Z23 Encounter for immunization: Secondary | ICD-10-CM | POA: Diagnosis not present

## 2018-01-31 DIAGNOSIS — S6991XA Unspecified injury of right wrist, hand and finger(s), initial encounter: Secondary | ICD-10-CM | POA: Diagnosis present

## 2018-01-31 DIAGNOSIS — Y929 Unspecified place or not applicable: Secondary | ICD-10-CM | POA: Diagnosis not present

## 2018-01-31 DIAGNOSIS — Y999 Unspecified external cause status: Secondary | ICD-10-CM | POA: Insufficient documentation

## 2018-01-31 DIAGNOSIS — W540XXA Bitten by dog, initial encounter: Secondary | ICD-10-CM | POA: Insufficient documentation

## 2018-01-31 DIAGNOSIS — Z79899 Other long term (current) drug therapy: Secondary | ICD-10-CM | POA: Insufficient documentation

## 2018-01-31 DIAGNOSIS — Y9389 Activity, other specified: Secondary | ICD-10-CM | POA: Diagnosis not present

## 2018-01-31 DIAGNOSIS — S61216A Laceration without foreign body of right little finger without damage to nail, initial encounter: Secondary | ICD-10-CM | POA: Insufficient documentation

## 2018-01-31 MED ORDER — TETANUS-DIPHTH-ACELL PERTUSSIS 5-2.5-18.5 LF-MCG/0.5 IM SUSP
0.5000 mL | Freq: Once | INTRAMUSCULAR | Status: AC
  Administered 2018-01-31: 0.5 mL via INTRAMUSCULAR
  Filled 2018-01-31: qty 0.5

## 2018-01-31 MED ORDER — AMOXICILLIN-POT CLAVULANATE 875-125 MG PO TABS: 1.0000 | ORAL_TABLET | Freq: Two times a day (BID) | ORAL | 0 refills | Status: DC

## 2018-01-31 MED ORDER — LIDOCAINE HCL (PF) 1 % IJ SOLN
10.0000 mL | Freq: Once | INTRAMUSCULAR | Status: AC
  Administered 2018-01-31: 10 mL
  Filled 2018-01-31: qty 30

## 2018-01-31 NOTE — ED Provider Notes (Signed)
[facility]-EMERGENCY DEPT Provider Note   CSN: [id number] Arrival date & time: 01/31/18  1809     History   Chief Complaint Chief Complaint  Patient presents with  . Animal Bite    HPI Robert Byrd is a 27 y.o. male.  Robert Byrd is a 27 y.o. Male with a history of seizures, who presents to the emergency department for evaluation after a dog bite.  Patient reports he was taking his 2 dogs outside when they started to fight, he tried to separate them and 1 of them bit down on his right pinky finger causing a laceration.  Patient denies any other wounds from the dog.  He reports these are his personal dogs and they are up-to-date on all vaccinations, specifically there up-to-date on rabies vaccines.  Patient reports initially the wound was bleeding a lot but has since slowed down.  He is unsure if he has had a tetanus vaccine in the last 5 years.  She reports he is able to completely bend and extend of the pinky without difficulty, though it is painful.  No numbness or weakness.  He has not noted any swelling or redness since the injury took place.     Past Medical History:  Diagnosis Date  . Seizure [facility]) 1993    Patient Active Problem List   Diagnosis Date Noted  . Therapeutic drug monitoring 06/15/2017  . 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 No pertinent surgical history.      Home Medications    Prior to Admission medications   Medication Sig Start Date End Date Taking? Authorizing Provider  albuterol (PROVENTIL HFA;VENTOLIN HFA) 108 (90 BASE) MCG/ACT inhaler Inhale 3 puffs into the lungs.    [provider]  amitriptyline (ELAVIL) 25 MG tablet Take 1 tablet (25 mg total) by mouth at bedtime. 12/20/14   Anson Fret, MD  carbamazepine (TEGRETOL XR) 200 MG 12 hr  tablet 3 pills twice daily. 12/28/17   Nilda Riggs, NP  Fluticasone-Salmeterol (ADVAIR DISKUS IN) Inhale 2 puffs into the lungs daily.    [provider]  Lacosamide 150 MG TABS Take 1 tablet (150 mg total) by mouth 2 (two) times daily. 12/28/17   Nilda Riggs, NP  montelukast (SINGULAIR) 10 MG tablet Take 10 mg by mouth at bedtime.    [provider]    Family History Family History  Problem Relation Age of Onset  . Hypertension Maternal Grandmother   . Diabetes Maternal Grandfather     Social History Social History   Tobacco Use  . Smoking status: Never Smoker  . Smokeless tobacco: Never Used  Substance Use Topics  . Alcohol use: No    Alcohol/week: 0.0 oz  . Drug use: No     Allergies   Patient has no known allergies.   Review of Systems Review of Systems  Constitutional: Negative for chills and fever.  Musculoskeletal: Positive for arthralgias. Negative for joint swelling.  Skin: Positive for wound. Negative for color change and rash.  Neurological: Negative for weakness and numbness.     Physical Exam Updated Vital Signs BP (!) 143/90 (BP Location: Left Arm)   Pulse 74   Temp 98.1 F (36.7 C) (Oral)   Resp 18   Ht  (1.854 m)   Wt 93 kg (205 lb)   SpO2  98%   BMI 27.05 kg/m   Physical Exam  Constitutional: He appears well-developed and well-nourished. No distress.  HENT:  Head: Normocephalic and atraumatic.  Eyes: Right eye exhibits no discharge. Left eye exhibits no discharge.  Pulmonary/Chest: Effort normal. No respiratory distress.  Musculoskeletal:  2 cm laceration across the DIP joint of the right pinky, wound was anesthetized and fully explored, there does not appear to be any tendon damage, tendon visualized and appears to be intact and patient can flex and extend the finger against resistance without any weakness.  Sensation intact, 2+ radial pulse and good capillary refill.  Neurological: He is alert.  Coordination normal.  Skin: Skin is warm and dry. He is not diaphoretic.  Psychiatric: He has a normal mood and affect. His behavior is normal.  Nursing note and vitals reviewed.    ED Treatments / Results  Labs (all labs ordered are listed, but only abnormal results are displayed) Labs Reviewed - No data to display  EKG None  Radiology No results found.  Procedures .Marland KitchenLaceration Repair Date/Time: 01/31/2018 10:19 PM Performed by: Dartha Lodge, PA-C Authorized by: Dartha Lodge, PA-C   Consent:    Consent obtained:  Verbal   Consent given by:  Patient   Risks discussed:  Pain, need for additional repair, infection and poor wound healing   Alternatives discussed:  No treatment Anesthesia (see MAR for exact dosages):    Anesthesia method:  Nerve block   Block location:  Right 5th finger   Block needle gauge:  25 G   Block anesthetic:  Lidocaine 1% w/o epi   Block injection procedure:  Anatomic landmarks identified, introduced needle and incremental injection   Block outcome:  Anesthesia achieved Laceration details:    Location:  Finger   Finger location:  R small finger   Length (cm):  2   Depth (mm):  5 Repair type:    Repair type:  Intermediate Pre-procedure details:    Preparation:  Imaging obtained to evaluate for foreign bodies and patient was prepped and draped in usual sterile fashion Exploration:    Hemostasis achieved with:  Direct pressure   Wound exploration: wound explored through full range of motion and entire depth of wound probed and visualized     Wound extent: areolar tissue violated     Wound extent: no tendon damage noted and no underlying fracture noted   Treatment:    Area cleansed with:  Saline   Amount of cleaning:  Extensive   Irrigation solution:  Sterile saline   Irrigation method:  Pressure wash Skin repair:    Repair method:  Sutures   Suture size:  4-0   Suture material:  Prolene   Suture technique:  Simple interrupted   Number  of sutures:  5 Approximation:    Approximation:  Loose Post-procedure details:    Dressing:  Antibiotic ointment and non-adherent dressing   Patient tolerance of procedure:  Tolerated well, no immediate complications   (including critical care time)  Medications Ordered in ED Medications  lidocaine (PF) (XYLOCAINE) 1 % injection 10 mL (10 mLs Infiltration Given 01/31/18 2028)  Tdap (BOOSTRIX) injection 0.5 mL (0.5 mLs Intramuscular Given 01/31/18 2030)     Initial Impression / Assessment and Plan / ED Course  I have reviewed the triage vital signs and the nursing notes.  Pertinent labs & imaging results that were available during my care of the patient were reviewed by me and considered in my medical decision making (see  chart for details).  Patient presents for evaluation after he sustained a dog bite with laceration to the right pinky finger.  Finger appears to be neurovascularly intact with good range of motion, no evidence of tendon damage.  X-ray shows no evidence of fracture or foreign body.  The wound was copiously irrigated, anesthetized with digital block and loosely approximated with simple interrupted sutures.  Patient tetanus vaccine was updated.  He will be placed on Augmentin for infection prophylaxis, he has a primary care doctor that he sees regularly who he will follow-up within 2 days for a wound check.  Strict return precautions discussed.  Patient expresses understanding and is in agreement with plan.   Final Clinical Impressions(s) / ED Diagnoses   Final diagnoses:  Dog bite, initial encounter  Laceration of right little finger without foreign body without damage to nail, initial encounter    ED Discharge Orders        Ordered    amoxicillin-clavulanate (AUGMENTIN) 875-125 MG tablet  2 times daily     01/31/18 2135       Legrand Rams 02/01/18 1157    Tilden Fossa, MD 02/03/18 1236

## 2018-01-31 NOTE — ED Triage Notes (Signed)
Patient reports he was bit by his dog today. Laceration to right pinky. Movement and sensation to finger.

## 2018-01-31 NOTE — ED Notes (Signed)
Patient transported to X-ray 

## 2018-01-31 NOTE — Discharge Instructions (Signed)
Take entire course of antibiotics as prescribed.  Keep wound covered, clean and dry.  You may shower but do not submerge the hand under water.  You will need to follow-up in 48 hours with your primary care doctor for a wound check, this is extremely important as dog bites have high risk for infection.  You may use ibuprofen or Tylenol as needed for pain.  If you notice redness, swelling, drainage from the wound, or increasing pain return to the emergency department for reevaluation.  Your sutures will need to be removed in 7 to 10 days.

## 2018-05-22 IMAGING — CR DG FINGER LITTLE 2+V*R*
3 series · 3 of 3 positions shown · non-contrast
Comparison: None.

CLINICAL DATA: Bit by dog

EXAM:
RIGHT LITTLE FINGER 2+V

[x finger pa right]
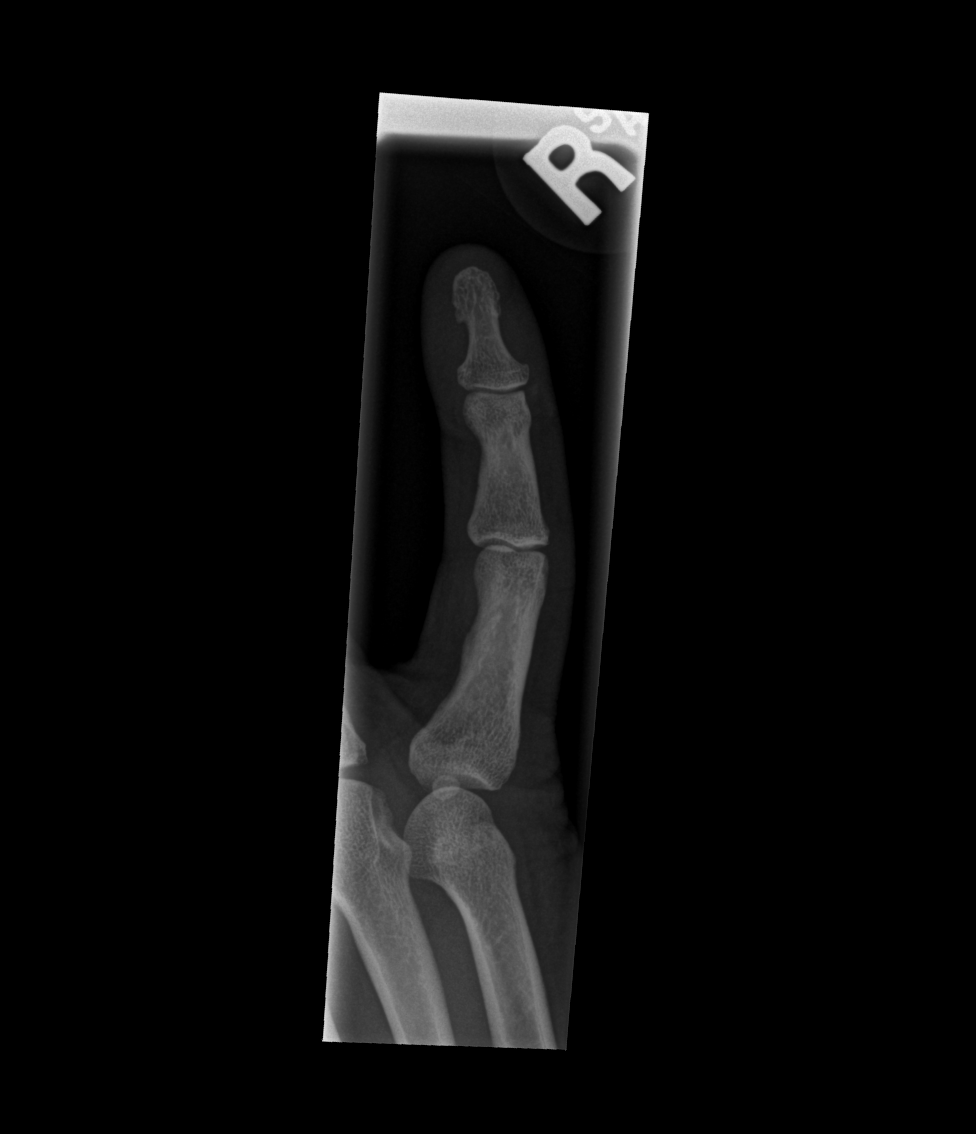

[x finger obl right]
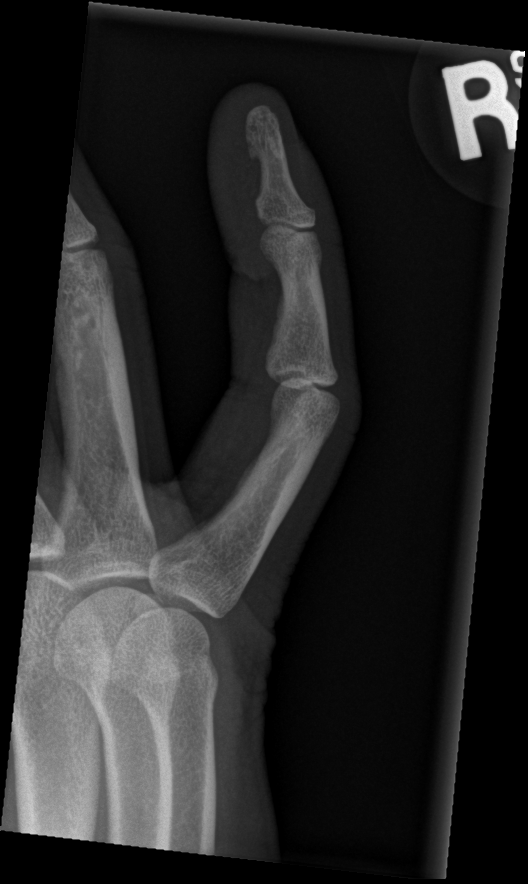

[x finger lat right]
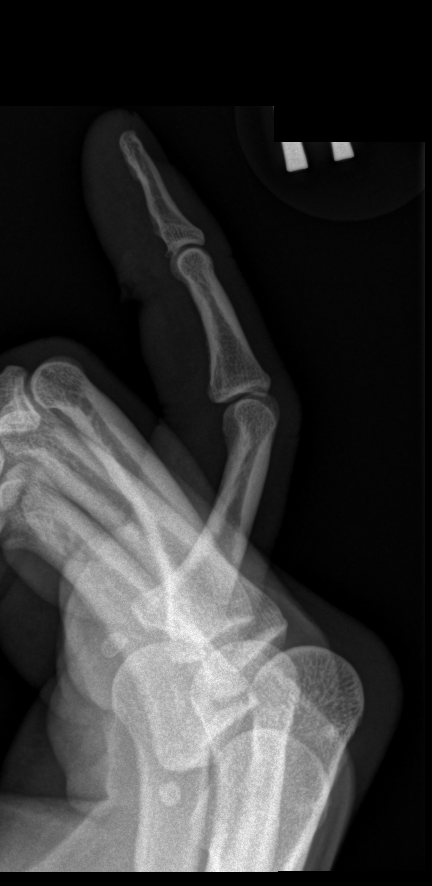

[3 of 3 positions shown; findings below may reference images not displayed]

FINDINGS: There is no evidence of fracture or dislocation. There is no
evidence of arthropathy or other focal bone abnormality. Soft
tissues are unremarkable.
IMPRESSION: Negative.

## 2018-06-24 NOTE — Progress Notes (Deleted)
[facility]  PATIENT: Robert Byrd DOB: 02-28-1991   REASON FOR VISIT: Seizure disorder HISTORY FROM: Patient, wife    HISTORY OF PRESENT ILLNESS:UPDATE 4/11/19CM Robert Byrd, 27 year old male returns for follow-up with history of seizure disorder.  He reports that he has had 4-5 seizures in the last year.  He has not kept a diary, one recently when he was in the car with his wife when  she was driving he has missed some doses of his medication.  He also reports that he was on narcotics for back pain several months ago.  He has had therapeutic levels of his medication in the past.  He is currently taking Vimpat 150 twice daily and Tegretol 200 XR 3 in the morning and 2 at night.  He returns for reevaluation.  He knows he cannot drive for 6 months   UPDATE 04/09/2018CM Robert Byrd 27 year old male returns for follow-up of his wife. He has a history of seizure disorder starting at the age 34. He is currently on Tegretol and Vimpat without side effects. He denies missing any doses of his medication. In the past he has had seizures when missing doses of his medication. He returns for reevaluation. 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    UPDATE 10/09/2017CM Robert Byrd, 27 year old male returns for follow-up he has a history of seizure disorder. He is currently on Tegretol 200 mg 2 in the morning and 3 at night along with Vimpat 150 twice daily. He ran out of his Vimpat Saturday morning and had a seizure Sunday  Afternoon while watching football. He has not missed doses of his Tegretol. He returns for reevaluation. He states he wants to have meds increased however the important thing is take the medication as directed and do not run out of medication. He returns for reevaluation   HISTORY AASeizures started at age 37. Complex partial and GTCs. [facility] neurology as a child. Dr Hyacinth Meeker did an MRI of the brain last when he was [age]. Last EEG when he was 4. Seizures started with  staring. He has an aura, starts to feel cold on the inside of his body, he tries to talk a lot, he "passes out". Mother provides most information. He starts salivating, starts moving his tongue around, he can't answer the simplest questions, he starts chewing a lot, staring off. Last GTCS was at age 37, denies missing dosages. He has been on tegretol all his life. Then they added Vimpat. Has had >3 events in the last month, woke up after urinating in the bed. Girlfriend said he had an episode of talking and saying strange things, afterwards is tired and has to sleep. Not missing doses. Previous to this, he has been controlled ok, maybe one seizure a month. Recently there was an error in his medications, he was on 100mg  vimpat bid and was changed to 50mg  bid and maybe that increased the seizures however was still having them once a month previously.   Also having daily pressure type headaches. Worsening. No weakness. No focal neurologic symptoms. Takes OTC medications daily.   REVIEW OF SYSTEMS: Full 14 system review of systems performed and notable only for those listed, all others are neg:  Constitutional: neg  Cardiovascular: neg Ear/Nose/Throat: neg  Skin: neg Eyes: neg Respiratory: neg Gastroitestinal: neg  Hematology/Lymphatic: neg  Endocrine: neg Musculoskeletal:neg Allergy/Immunology: neg Neurological: Seizure disorder Psychiatric: neg Sleep : neg   ALLERGIES: No Known Allergies  HOME MEDICATIONS: Outpatient Medications Prior to Visit  Medication Sig Dispense Refill  . amoxicillin-clavulanate (AUGMENTIN) 875-125 MG tablet Take 1 tablet by mouth 2 (two) times daily. One po bid x 7 days 14 tablet 0  . carbamazepine (TEGRETOL XR) 200 MG 12 hr tablet 3 pills twice daily. (Patient taking differently: Take 600 mg by mouth 2 (two) times daily. 3 pills twice daily.) 180 tablet 11  . Lacosamide 150 MG TABS Take 1 tablet (150 mg total) by mouth 2 (two) times daily. 60 tablet 5  .  montelukast (SINGULAIR) 10 MG tablet Take 10 mg by mouth at bedtime.    . terbinafine (LAMISIL) 250 MG tablet Take 250 mg by mouth daily.     No facility-administered medications prior to visit.     PAST MEDICAL HISTORY: Past Medical History:  Diagnosis Date  . Seizure (HCC) 1993    PAST SURGICAL HISTORY: No past surgical history on file.  FAMILY HISTORY: Family History  Problem Relation Age of Onset  . Hypertension Maternal Grandmother   . Diabetes Maternal Grandfather     SOCIAL HISTORY: Social History   Socioeconomic History  . Marital status: Married    Spouse name: Sue Lush  . Number of children: 1  . Years of education: Bachelor's  . Highest education level: Not on file  Occupational History  . Occupation: [employer]  Social Needs  . Financial resource strain: Not on file  . Food insecurity:    Worry: Not on file    Inability: Not on file  . Transportation needs:    Medical: Not on file    Non-medical: Not on file  Tobacco Use  . Smoking status: Never Smoker  . Smokeless tobacco: Never Used  Substance and Sexual Activity  . Alcohol use: No    Alcohol/week: 0.0 standard drinks  . Drug use: No  . Sexual activity: Not on file  Lifestyle  . Physical activity:    Days per week: Not on file    Minutes per session: Not on file  . Stress: Not on file  Relationships  . Social connections:    Talks on phone: Not on file    Gets together: Not on file    Attends religious service: Not on file    Active member of club or organization: Not on file    Attends meetings of clubs or organizations: Not on file    Relationship status: Not on file  . Intimate partner violence:    Fear of current or ex partner: Not on file    Emotionally abused: Not on file    Physically abused: Not on file    Forced sexual activity: Not on file  Other Topics Concern  . Not on file  Social History Narrative   Live at with home with wife, child.   Right handed.   Caffeine  use: Drinks soda occassionally      PHYSICAL EXAM  There were no vitals filed for this visit. There is no height or weight on file to calculate BMI.  Generalized: Well developed, in no acute distress  Head: normocephalic and atraumatic,. Oropharynx benign  Neck: Supple,  Musculoskeletal: No deformity   Neurological examination   Mentation: Alert oriented to time, place, history taking. Attention span and concentration appropriate. Recent and remote memory intact.  Follows all commands speech and language fluent.   Cranial nerve II-XII: Pupils were equal round reactive to light extraocular movements were full, visual field were full on confrontational test. Facial sensation and strength were normal. hearing was  intact to finger rubbing bilaterally. Uvula tongue midline. head turning and shoulder shrug were normal and symmetric.Tongue protrusion into cheek strength was normal. Motor: normal bulk and tone, full strength in the BUE, BLE, fine finger movements normal, no pronator drift. No focal weakness Sensory: normal and symmetric to light touch,  Coordination: finger-nose-finger, heel-to-shin bilaterally, no dysmetria Reflexes: Symmetric upper and lower, plantar responses were flexor bilaterally. Gait and Station: Rising up from seated position without assistance, normal stance,  moderate stride, good arm swing, smooth turning, able to perform tiptoe, and heel walking without difficulty. Tandem gait is steady  DIAGNOSTIC DATA (LABS, IMAGING, TESTING) - I reviewed patient records, labs, notes, testing and imaging myself where available.      Component Value Date/Time   NA 142 12/24/2017 1555   K 4.2 12/24/2017 1555   CL 101 12/24/2017 1555   CO2 26 12/24/2017 1555   GLUCOSE 90 12/24/2017 1555   BUN 14 12/24/2017 1555   CREATININE 1.13 12/24/2017 1555   CALCIUM 9.5 12/24/2017 1555   PROT 7.8 12/24/2017 1555   ALBUMIN 4.8 12/24/2017 1555   AST 20 12/24/2017 1555   ALT 28  12/24/2017 1555   ALKPHOS 84 12/24/2017 1555   BILITOT <0.2 12/24/2017 1555   GFRNONAA 88 12/24/2017 1555   GFRAA 102 12/24/2017 1555    ASSESSMENT AND PLAN  27 y.o. year old male  has a past medical history of Seizure (HCC) (1993). here to follow-up.  He claims he has had for the past seizures in the last year he has not kept a diary he has missed several doses of his medication.  He also was on narcotics several months ago for back pain.  He returns for reevaluation   PLAN: Continue  Vimpat 150mg  twice dailyfor now  Continue Tegretol 200mg  2 in the am and 3 in the pm Check level of Tegretol and Vimpat to monitor for therapeutic level then adjust meds if needed CBC and CMP to monitor for adverse effects of seizure meds Follow up 6 months Please remember, common seizure triggers are: Sleep deprivation, dehydration, overheating, stress, hypoglycemia or skipping meals, certain medications or excessive alcohol use, especially stopping alcohol abruptly if you have had heavy alcohol use before (aka alcohol withdrawal seizure). If you have a prolonged seizure over 2-5 minutes or back to back seizures, call or have someone call 911 or take you to the nearest emergency room. You cannot drive a car or operate any other machinery or vehicle within 6 months of a seizure. Please do not swim alone  Take your medicine for seizure prevention regularly and do not skip doses or stop medication abruptly and tone are told to do so by your healthcare provider.  Avoid taking Wellbutrin, narcotic pain medications and tramadol, as they can lower seizure threshold.  I spent 25 minutes in total face to face time with the patient more than 50% of which was spent counseling and coordination of care, reviewing test results reviewing medications and discussing and reviewing the diagnosis of seizure and discussing seizure  triggers . Nilda Riggs, [facility], [facility], APRN  [facility] [address] [place], [place] [address] [phone]

## 2018-06-28 ENCOUNTER — Ambulatory Visit: Payer: BLUE CROSS/BLUE SHIELD | Admitting: Nurse Practitioner

## 2018-06-28 ENCOUNTER — Encounter: Payer: Self-pay | Admitting: Nurse Practitioner

## 2018-10-27 NOTE — Progress Notes (Signed)
[facility]  PATIENT: Robert Byrd DOB: 04-24-1991   REASON FOR VISIT: Seizure disorder HISTORY FROM: Patient, wife    HISTORY OF PRESENT ILLNESS:UPDATE 2/13/2020CM [name] , 28 year old male returns for follow-up with history of seizure disorder he reports about 3 brief seizures since last seen in April of last year.  His dosage was changed in April and said he says it worked well for about 6 to 7 months these have occurred in the last couple of months.  One was due to a stressful situation where he had to speak in front of a group.  He is currently on Tegretol-XR 200 mg 3 tablets twice daily.  He is also on Vimpat 150 twice daily.  Denies missing any doses of his medication.  He returns for reevaluation.    UPDATE 4/11/19CM [name], 28 year old male returns for follow-up with history of seizure disorder.  He reports that he has had 4-5 seizures in the last year.  He has not kept a diary, one recently when he was in the car with his wife when  she was driving he has missed some doses of his medication.  He also reports that he was on narcotics for back pain several months ago.  He has had therapeutic levels of his medication in the past.  He is currently taking Vimpat 150 twice daily and Tegretol 200 XR 3 in the morning and 2 at night.  He returns for reevaluation.  He knows he cannot drive for 6 months   UPDATE 04/09/2018CM [name] 28 year old male returns for follow-up of his wife. He has a history of seizure disorder starting at the age 89. He is currently on Tegretol and Vimpat without side effects. He denies missing any doses of his medication. In the past he has had seizures when missing doses of his medication. He returns for reevaluation. 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    UPDATE 10/09/2017CM [name], 28 year old male returns for follow-up he has a history of seizure disorder. He is currently on Tegretol 200 mg 2 in the morning and 3 at  night along with Vimpat 150 twice daily. He ran out of his Vimpat Saturday morning and had a seizure Sunday  Afternoon while watching football. He has not missed doses of his Tegretol. He returns for reevaluation. He states he wants to have meds increased however the important thing is take the medication as directed and do not run out of medication. He returns for reevaluation   HISTORY AASeizures started at age 89. Complex partial and GTCs[facility]. Dr Hyacinth MeekerMiller did an MRI of the brain last when he was [age]. Last EEG when he was 4. Seizures started with staring. He has an aura, starts to feel cold on the inside of his body, he tries to talk a lot, he "passes out". Mother provides most information. He starts salivating, starts moving his tongue around, he can't answer the simplest questions, he starts chewing a lot, staring off. Last GTCS was at age 28, denies missing dosages. He has been on tegretol all his life. Then they added Vimpat. Has had >3 events in the last month, woke up after urinating in the bed. Girlfriend said he had an episode of talking and saying strange things, afterwards is tired and has to sleep. Not missing doses. Previous to this, he has been controlled ok, maybe one seizure a month. Recently there was an error in his medications, he was on  100mg  vimpat bid and was changed to 50mg  bid and maybe that increased the seizures however was still having them once a month previously.   Also having daily pressure type headaches. Worsening. No weakness. No focal neurologic symptoms. Takes OTC medications daily.   REVIEW OF SYSTEMS: Full 14 system review of systems performed and notable only for those listed, all others are neg:  Constitutional: neg  Cardiovascular: neg Ear/Nose/Throat: neg  Skin: neg Eyes: neg Respiratory: neg Gastroitestinal: neg  Hematology/Lymphatic: neg  Endocrine: neg Musculoskeletal:neg Allergy/Immunology: neg Neurological: Seizure  disorder Psychiatric: neg Sleep : neg   ALLERGIES: No Known Allergies  HOME MEDICATIONS: Outpatient Medications Prior to Visit  Medication Sig Dispense Refill  . carbamazepine (TEGRETOL XR) 200 MG 12 hr tablet 3 pills twice daily. (Patient taking differently: Take 600 mg by mouth 2 (two) times daily. 3 pills twice daily.) 180 tablet 11  . Fluticasone-Salmeterol (ADVAIR) 250-50 MCG/DOSE AEPB Inhale 1 puff into the lungs 2 (two) times daily.    . Lacosamide 150 MG TABS Take 1 tablet (150 mg total) by mouth 2 (two) times daily. 60 tablet 5  . montelukast (SINGULAIR) 10 MG tablet Take 10 mg by mouth at bedtime.    Marland Kitchen amoxicillin-clavulanate (AUGMENTIN) 875-125 MG tablet Take 1 tablet by mouth 2 (two) times daily. One po bid x 7 days (Patient not taking: Reported on 10/28/2018) 14 tablet 0  . terbinafine (LAMISIL) 250 MG tablet Take 250 mg by mouth daily.     No facility-administered medications prior to visit.     PAST MEDICAL HISTORY: Past Medical History:  Diagnosis Date  . Seizure (HCC) 1993    PAST SURGICAL HISTORY: No past surgical history on file.  FAMILY HISTORY: Family History  Problem Relation Age of Onset  . Hypertension Maternal Grandmother   . Diabetes Maternal Grandfather     SOCIAL HISTORY: Social History   Socioeconomic History  . Marital status: Married    Spouse name: Robert Byrd  . Number of children: 1  . Years of education: Bachelor's  . Highest education level: Not on file  Occupational History  . Occupation: [employer]  Social Needs  . Financial resource strain: Not on file  . Food insecurity:    Worry: Not on file    Inability: Not on file  . Transportation needs:    Medical: Not on file    Non-medical: Not on file  Tobacco Use  . Smoking status: Never Smoker  . Smokeless tobacco: Never Used  Substance and Sexual Activity  . Alcohol use: No    Alcohol/week: 0.0 standard drinks  . Drug use: No  . Sexual activity: Not on file  Lifestyle   . Physical activity:    Days per week: Not on file    Minutes per session: Not on file  . Stress: Not on file  Relationships  . Social connections:    Talks on phone: Not on file    Gets together: Not on file    Attends religious service: Not on file    Active member of club or organization: Not on file    Attends meetings of clubs or organizations: Not on file    Relationship status: Not on file  . Intimate partner violence:    Fear of current or ex partner: Not on file    Emotionally abused: Not on file    Physically abused: Not on file    Forced sexual activity: Not on file  Other Topics Concern  .  Not on file  Social History Narrative   Live at with home with wife, child.   Right handed.   Caffeine use: Drinks soda occassionally      PHYSICAL EXAM  Vitals:   10/28/18 1353  BP: 124/81  Pulse: 75  Height: 6\' 1"  (1.854 m)   Body mass index is 27.05 kg/m.  Generalized: Well developed, in no acute distress  Head: normocephalic and atraumatic,. Oropharynx benign  Neck: Supple,  Musculoskeletal: No deformity   Neurological examination   Mentation: Alert oriented to time, place, history taking. Attention span and concentration appropriate. Recent and remote memory intact.  Follows all commands speech and language fluent.   Cranial nerve II-XII: Pupils were equal round reactive to light extraocular movements were full, visual field were full on confrontational test. Facial sensation and strength were normal. hearing was intact to finger rubbing bilaterally. Uvula tongue midline. head turning and shoulder shrug were normal and symmetric.Tongue protrusion into cheek strength was normal. Motor: normal bulk and tone, full strength in the BUE, BLE, fine finger movements normal, no pronator drift. No focal weakness Sensory: normal and symmetric to light touch,  Coordination: finger-nose-finger, heel-to-shin bilaterally, no dysmetria Reflexes: Symmetric upper and lower,  plantar responses were flexor bilaterally. Gait and Station: Rising up from seated position without assistance, normal stance,  moderate stride, good arm swing, smooth turning, able to perform tiptoe, and heel walking without difficulty. Tandem gait is steady  DIAGNOSTIC DATA (LABS, IMAGING, TESTING) - I reviewed patient records, labs, notes, testing and imaging myself where available.      Component Value Date/Time   NA 142 12/24/2017 1555   K 4.2 12/24/2017 1555   CL 101 12/24/2017 1555   CO2 26 12/24/2017 1555   GLUCOSE 90 12/24/2017 1555   BUN 14 12/24/2017 1555   CREATININE 1.13 12/24/2017 1555   CALCIUM 9.5 12/24/2017 1555   PROT 7.8 12/24/2017 1555   ALBUMIN 4.8 12/24/2017 1555   AST 20 12/24/2017 1555   ALT 28 12/24/2017 1555   ALKPHOS 84 12/24/2017 1555   BILITOT <0.2 12/24/2017 1555   GFRNONAA 88 12/24/2017 1555   GFRAA 102 12/24/2017 1555    ASSESSMENT AND PLAN  28 y.o. year old male  has a past medical history of Seizure (HCC) (1993). here to follow-up.  He claims he has had 3 seizures in the last several months which were mild.  One was due to stress having to speak in front of a group.  He denies missing any doses of his medication.  He is not driving.  He returns for reevaluation   PLAN: Continue  Vimpat 150mg  twice daily for now  Continue Tegretol 200mg  3 in the am and 3 in the pm will refill when labs back Check level of Tegretol and Vimpat to monitor for therapeutic level then adjust meds if needed CBC and CMP to monitor for adverse effects of seizure meds Follow up 6 months Nilda Riggs, [facility], [facility], APRN  [facility] [address] [place], [place] [address] [phone]

## 2018-10-28 ENCOUNTER — Encounter: Payer: Self-pay | Admitting: Nurse Practitioner

## 2018-10-28 ENCOUNTER — Ambulatory Visit: Payer: BLUE CROSS/BLUE SHIELD | Admitting: Nurse Practitioner

## 2018-10-28 VITALS — BP 124/81 | HR 75 | Ht 73.0 in

## 2018-10-28 DIAGNOSIS — G40909 Epilepsy, unspecified, not intractable, without status epilepticus: Secondary | ICD-10-CM | POA: Diagnosis not present

## 2018-10-28 DIAGNOSIS — G40209 Localization-related (focal) (partial) symptomatic epilepsy and epileptic syndromes with complex partial seizures, not intractable, without status epilepticus: Secondary | ICD-10-CM | POA: Diagnosis not present

## 2018-10-28 DIAGNOSIS — Z5181 Encounter for therapeutic drug level monitoring: Secondary | ICD-10-CM | POA: Diagnosis not present

## 2018-10-28 NOTE — Patient Instructions (Signed)
Continue  Vimpat 150mg  twice dailyfor now  Continue Tegretol 200mg  3 in the am and 3 in the pm Check level of Tegretol and Vimpat to monitor for therapeutic level then adjust meds if needed CBC and CMP to monitor for adverse effects of seizure meds Follow up 6 months

## 2018-10-30 LAB — COMPREHENSIVE METABOLIC PANEL
ALK PHOS: 88 IU/L (ref 39–117)
ALT: 21 IU/L (ref 0–44)
AST: 18 IU/L (ref 0–40)
Albumin/Globulin Ratio: 1.6 (ref 1.2–2.2)
Albumin: 4.8 g/dL (ref 4.1–5.2)
BUN / CREAT RATIO: 12 (ref 9–20)
BUN: 12 mg/dL (ref 6–20)
Bilirubin Total: <0.2 mg/dL (ref 0.0–1.2)
CO2: 27 mmol/L (ref 20–29)
CREATININE: 0.99 mg/dL (ref 0.76–1.27)
Calcium: 9.8 mg/dL (ref 8.7–10.2)
Chloride: 100 mmol/L (ref 96–106)
GFR, EST AFRICAN AMERICAN: 118 mL/min/{1.73_m2} (ref >59)
GFR, EST NON AFRICAN AMERICAN: 102 mL/min/{1.73_m2} (ref >59)
GLOBULIN, TOTAL: 3 g/dL (ref 1.5–4.5)
Glucose: 87 mg/dL (ref 65–99)
Potassium: 4.1 mmol/L (ref 3.5–5.2)
SODIUM: 143 mmol/L (ref 134–144)
TOTAL PROTEIN: 7.8 g/dL (ref 6.0–8.5)

## 2018-10-30 LAB — CBC WITH DIFFERENTIAL/PLATELET
BASOS: 1 %
Basophils Absolute: 0 10*3/uL (ref 0.0–0.2)
EOS (ABSOLUTE): 0.1 10*3/uL (ref 0.0–0.4)
Eos: 3 %
Hematocrit: 39.9 % (ref 37.5–51.0)
Hemoglobin: 14 g/dL (ref 13.0–17.7)
Immature Grans (Abs): 0 10*3/uL (ref 0.0–0.1)
Immature Granulocytes: 0 %
LYMPHS ABS: 1.8 10*3/uL (ref 0.7–3.1)
Lymphs: 63 %
MCH: 30.2 pg (ref 26.6–33.0)
MCHC: 35.1 g/dL (ref 31.5–35.7)
MCV: 86 fL (ref 79–97)
MONOCYTES: 6 %
Monocytes Absolute: 0.2 10*3/uL (ref 0.1–0.9)
Neutrophils Absolute: 0.8 10*3/uL — ABNORMAL LOW (ref 1.4–7.0)
Neutrophils: 27 %
Platelets: 242 10*3/uL (ref 150–450)
RBC: 4.63 x10E6/uL (ref 4.14–5.80)
RDW: 12.7 % (ref 11.6–15.4)
WBC: 2.8 10*3/uL — AB (ref 3.4–10.8)

## 2018-10-30 LAB — CARBAMAZEPINE LEVEL, TOTAL: Carbamazepine (Tegretol), S: 9.3 ug/mL (ref 4.0–12.0)

## 2018-10-30 LAB — LACOSAMIDE: Lacosamide: 4.9 ug/mL — ABNORMAL LOW (ref 5.0–10.0)

## 2018-10-30 NOTE — Progress Notes (Signed)
Made any corrections needed, and agree with history, physical, neuro exam,assessment and plan as stated.    Eber Jones, we can increase the Vimpat if you agree, looks like his levels are low and he continues to have seizures. thanks   Naomie Dean, MD [facility]

## 2018-11-01 ENCOUNTER — Telehealth: Payer: Self-pay | Admitting: Nurse Practitioner

## 2018-11-01 MED ORDER — CARBAMAZEPINE ER 200 MG PO TB12: 600.0000 mg | ORAL_TABLET | Freq: Two times a day (BID) | ORAL | 6 refills | Status: DC

## 2018-11-01 MED ORDER — LACOSAMIDE 200 MG PO TABS: 200.0000 mg | ORAL_TABLET | Freq: Two times a day (BID) | ORAL | 6 refills | Status: DC

## 2018-11-01 NOTE — Telephone Encounter (Signed)
Good level of carbamazepine however Vimpat level not therapeutic.  Increase Vimpat to 200 mg twice daily.  Continue Tegretol at current dose.  Meds renewed.  Please call the patient

## 2018-11-01 NOTE — Telephone Encounter (Signed)
Fax confirmation received [facility] [phone].  Spoke to pt and relayed that his carbamazepine was good level, vimpat was not within normal reference range , CM/NP wanted to increase vimpat to 200mg  po BID.  Continue same dose of carbamazepine.  He verbalized understanding.  Both meds were refilled.

## 2018-11-03 DIAGNOSIS — Z0289 Encounter for other administrative examinations: Secondary | ICD-10-CM

## 2019-04-27 NOTE — Patient Instructions (Addendum)
Continue Vimpat 200mg  twice daily as well as Tegretol 600mg  twice daily  Follow up in 6 months, sooner if needed    Seizure, Adult A seizure is a sudden burst of abnormal electrical activity in the brain. Seizures usually last from 30 seconds to 2 minutes. They can cause many different symptoms. Usually, seizures are not harmful unless they last a long time. What are the causes? Common causes of this condition include:  Fever or infection.  Conditions that affect the brain, such as: ? A brain abnormality that you were born with. ? A brain or head injury. ? Bleeding in the brain. ? A tumor. ? Stroke. ? Brain disorders such as autism or cerebral palsy.  Low blood sugar.  Conditions that are passed from parent to child (are inherited).  Problems with substances, such as: ? Having a reaction to a drug or a medicine. ? Suddenly stopping the use of a substance (withdrawal). In some cases, the cause may not be known. A person who has repeated seizures over time without a clear cause has a condition called epilepsy. What increases the risk? You are more likely to get this condition if you have:  A family history of epilepsy.  Had a seizure in the past.  A brain disorder.  A history of head injury, lack of oxygen at birth, or strokes. What are the signs or symptoms? There are many types of seizures. The symptoms vary depending on the type of seizure you have. Examples of symptoms during a seizure include:  Shaking (convulsions).  Stiffness in the body.  Passing out (losing consciousness).  Head nodding.  Staring.  Not responding to sound or touch.  Loss of bladder control and bowel control. Some people have symptoms right before and right after a seizure happens. Symptoms before a seizure may include:  Fear.  Worry (anxiety).  Feeling like you may vomit (nauseous).  Feeling like the room is spinning (vertigo).  Feeling like you saw or heard something before  (dj vu).  Odd tastes or smells.  Changes in how you see. You may see flashing lights or spots. Symptoms after a seizure happens can include:  Confusion.  Sleepiness.  Headache.  Weakness on one side of the body. How is this treated? Most seizures will stop on their own in under 5 minutes. In these cases, no treatment is needed. Seizures that last longer than 5 minutes will usually need treatment. Treatment can include:  Medicines given through an IV tube.  Avoiding things that are known to cause your seizures. These can include medicines that you take for another condition.  Medicines to treat epilepsy.  Surgery to stop the seizures. This may be needed if medicines do not help. Follow these instructions at home: Medicines  Take over-the-counter and prescription medicines only as told by your doctor.  Do not eat or drink anything that may keep your medicine from working, such as alcohol. Activity  Do not do any activities that would be dangerous if you had another seizure, like driving or swimming. Wait until your doctor says it is safe for you to do them.  If you live in the [place] ask your local DMV (department of motor vehicles) when you can drive.  Get plenty of rest. Teaching others Teach friends and family what to do when you have a seizure. They should:  Lay you on the ground.  Protect your head and body.  Loosen any tight clothing around your neck.  Turn you on your  side.  Not hold you down.  Not put anything into your mouth.  Know whether or not you need emergency care.  Stay with you until you are better.  General instructions  Contact your doctor each time you have a seizure.  Avoid anything that gives you seizures.  Keep a seizure diary. Write down: ? What you think caused each seizure. ? What you remember about each seizure.  Keep all follow-up visits as told by your doctor. This is important. Contact a doctor if:  You have another  seizure.  You have seizures more often.  There is any change in what happens during your seizures.  You keep having seizures with treatment.  You have symptoms of being sick or having an infection. Get help right away if:  You have a seizure that: ? Lasts longer than 5 minutes. ? Is different than seizures you had before. ? Makes it harder to breathe. ? Happens after you hurt your head.  You have any of these symptoms after a seizure: ? Not being able to speak. ? Not being able to use a part of your body. ? Confusion. ? A bad headache.  You have two or more seizures in a row.  You do not wake up right after a seizure.  You get hurt during a seizure. These symptoms may be an emergency. Do not wait to see if the symptoms will go away. Get medical help right away. Call your local emergency services (911 in the [place] Do not drive yourself to the hospital. Summary  Seizures usually last from 30 seconds to 2 minutes. Usually, they are not harmful unless they last a long time.  Do not eat or drink anything that may keep your medicine from working, such as alcohol.  Teach friends and family what to do when you have a seizure.  Contact your doctor each time you have a seizure. This information is not intended to replace advice given to you by your health care provider. Make sure you discuss any questions you have with your health care provider. Document Released: 02/18/2008 Document Revised: 11/19/2018 Document Reviewed: 11/19/2018 Elsevier Patient Education  [place].

## 2019-04-27 NOTE — Progress Notes (Signed)
PATIENT: Robert Byrd DOB: Jun 14, 1991  REASON FOR VISIT: follow up HISTORY FROM: patient  Chief Complaint  Patient presents with  . Follow-up    Rm 5,   . Seizures    Doing well, no seizures,  Tolerating seizure meds.       HISTORY OF PRESENT ILLNESS: Today 04/28/19 Robert Byrd is a 28 y.o. male here today for follow up for seizures. He reported breakthrough seizure thought to be related to increased stress at last visit. Labs show subtherapeutic Vimpat level and dose was increased from 150mg  BID to 200mg  BID. He also takes Tegretol XR 600mg  BID.  He reports that he is tolerating medications well with no obvious adverse effects.  He has had no seizure activity since last being seen.  He is feeling well today and without complaints.  HISTORY: (copied from Grenoraarolyn Martin's note on 10/28/2018)  UPDATE 2/13/2020CM Robert Byrd , 28 year old male returns for follow-up with history of seizure disorder he reports about 3 brief seizures since last seen in April of last year.  His dosage was changed in April and said he says it worked well for about 6 to 7 months these have occurred in the last couple of months.  One was due to a stressful situation where he had to speak in front of a group.  He is currently on Tegretol-XR 200 mg 3 tablets twice daily.  He is also on Vimpat 150 twice daily.  Denies missing any doses of his medication.  He returns for reevaluation.  UPDATE 4/11/19CM Robert Byrd, 28 year old male returns for follow-up with history of seizure disorder.  He reports that he has had 4-5 seizures in the last year.  He has not kept a diary, one recently when he was in the car with his wife when  she was driving he has missed some doses of his medication.  He also reports that he was on narcotics for back pain several months ago.  He has had therapeutic levels of his medication in the past.  He is currently taking Vimpat 150 twice daily and Tegretol 200 XR 3 in the morning and 2 at night.  He  returns for reevaluation.  He knows he cannot drive for 6 months  UPDATE 04/09/2018CM Robert Byrd 28 year old male returns for follow-up of his wife. He has a history of seizure disorder starting at the age 89. He is currently on Tegretol and Vimpat without side effects. He denies missing any doses of his medication. In the past he has had seizures when missing doses of his medication. He returns for reevaluation. 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  UPDATE 10/09/2017CM Robert Byrd, 28 year old male returns for follow-up he has a history of seizure disorder. He is currently on Tegretol 200 mg 2 in the morning and 3 at night along with Vimpat 150 twice daily. He ran out of his Vimpat Saturday morning and had a seizure Sunday  Afternoon while watching football. He has not missed doses of his Tegretol. He returns for reevaluation. He states he wants to have meds increased however the important thing is take the medication as directed and do not run out of medication. He returns for reevaluation  HISTORY AASeizures started at age 89. Complex partial and GT[facility] Dr Hyacinth MeekerMiller did an MRI of the brain last when he was [age]. Last EEG when he was 4. Seizures started with staring. He has an aura, starts to feel cold on the inside  of his body, he tries to talk a lot, he "passes out". Mother provides most information. He starts salivating, starts moving his tongue around, he can't answer the simplest questions, he starts chewing a lot, staring off. Last GTCS was at age 28, denies missing dosages. He has been on tegretol all his life. Then they added Vimpat. Has had >3 events in the last month, woke up after urinating in the bed. Girlfriend said he had an episode of talking and saying strange things, afterwards is tired and has to sleep. Not missing doses. Previous to this, he has been controlled ok, maybe one seizure a month. Recently there was an error in his medications, he was on 100mg   vimpat bid and was changed to 50mg  bid and maybe that increased the seizures however was still having them once a month previously.   Also having daily pressure type headaches. Worsening. No weakness. No focal neurologic symptoms. Takes OTC medications daily.     REVIEW OF SYSTEMS: Out of a complete 14 system review of symptoms, the patient complains only of the following symptoms, none and all other reviewed systems are negative.   ALLERGIES: No Known Allergies  HOME MEDICATIONS: Outpatient Medications Prior to Visit  Medication Sig Dispense Refill  . Fluticasone-Salmeterol (ADVAIR) 250-50 MCG/DOSE AEPB Inhale 1 puff into the lungs 2 (two) times daily.    . montelukast (SINGULAIR) 10 MG tablet Take 10 mg by mouth at bedtime.    . carbamazepine (TEGRETOL XR) 200 MG 12 hr tablet Take 3 tablets (600 mg total) by mouth 2 (two) times daily. 3 pills twice daily. 120 tablet 6  . lacosamide (VIMPAT) 200 MG TABS tablet Take 1 tablet (200 mg total) by mouth 2 (two) times daily. 60 tablet 6   No facility-administered medications prior to visit.     PAST MEDICAL HISTORY: Past Medical History:  Diagnosis Date  . Seizure (HCC) 1993    PAST SURGICAL HISTORY: History reviewed. No pertinent surgical history.  FAMILY HISTORY: Family History  Problem Relation Age of Onset  . Hypertension Maternal Grandmother   . Diabetes Maternal Grandfather     SOCIAL HISTORY: Social History   Socioeconomic History  . Marital status: Married    Spouse name: Sue Lushndrea  . Number of children: 1  . Years of education: Bachelor's  . Highest education level: Not on file  Occupational History  . Occupation: [employer]  Social Needs  . Financial resource strain: Not on file  . Food insecurity    Worry: Not on file    Inability: Not on file  . Transportation needs    Medical: Not on file    Non-medical: Not on file  Tobacco Use  . Smoking status: Never Smoker  . Smokeless tobacco: Never Used   Substance and Sexual Activity  . Alcohol use: No    Alcohol/week: 0.0 standard drinks  . Drug use: No  . Sexual activity: Not on file  Lifestyle  . Physical activity    Days per week: Not on file    Minutes per session: Not on file  . Stress: Not on file  Relationships  . Social Musicianconnections    Talks on phone: Not on file    Gets together: Not on file    Attends religious service: Not on file    Active member of club or organization: Not on file    Attends meetings of clubs or organizations: Not on file    Relationship status: Not on file  .  Intimate partner violence    Fear of current or ex partner: Not on file    Emotionally abused: Not on file    Physically abused: Not on file    Forced sexual activity: Not on file  Other Topics Concern  . Not on file  Social History Narrative   Live at with home with wife, child.   Right handed.   Caffeine use: Drinks soda occassionally       PHYSICAL EXAM  Vitals:   04/28/19 1503  BP: 114/86  Pulse: 77  Temp: 98 F (36.7 C)  SpO2: 96%  Weight: 202 lb 12.8 oz (92 kg)  Height: 6\' 1"  (1.854 m)   Body mass index is 26.76 kg/m.  Generalized: Well developed, in no acute distress  Cardiology: normal rate and rhythm, no murmur noted Neurological examination  Mentation: Alert oriented to time, place, history taking. Follows all commands speech and language fluent Cranial nerve II-XII: Pupils were equal round reactive to light. Extraocular movements were full, visual field were full on confrontational test. Facial sensation and strength were normal. Uvula tongue midline. Head turning and shoulder shrug  were normal and symmetric. Motor: The motor testing reveals 5 over 5 strength of all 4 extremities. Good symmetric motor tone is noted throughout.  Sensory: Sensory testing is intact to soft touch on all 4 extremities. No evidence of extinction is noted.  Coordination: Cerebellar testing reveals good finger-nose-finger and  heel-to-shin bilaterally.  Gait and station: Gait is normal.   DIAGNOSTIC DATA (LABS, IMAGING, TESTING) - I reviewed patient records, labs, notes, testing and imaging myself where available.  No flowsheet data found.   Lab Results  Component Value Date   WBC 2.8 (L) 10/28/2018   HGB 14.0 10/28/2018   HCT 39.9 10/28/2018   MCV 86 10/28/2018   PLT 242 10/28/2018      Component Value Date/Time   NA 143 10/28/2018 1423   K 4.1 10/28/2018 1423   CL 100 10/28/2018 1423   CO2 27 10/28/2018 1423   GLUCOSE 87 10/28/2018 1423   BUN 12 10/28/2018 1423   CREATININE 0.99 10/28/2018 1423   CALCIUM 9.8 10/28/2018 1423   PROT 7.8 10/28/2018 1423   ALBUMIN 4.8 10/28/2018 1423   AST 18 10/28/2018 1423   ALT 21 10/28/2018 1423   ALKPHOS 88 10/28/2018 1423   BILITOT <0.2 10/28/2018 1423   GFRNONAA 102 10/28/2018 1423   GFRAA 118 10/28/2018 1423   No results found for: CHOL, HDL, LDLCALC, LDLDIRECT, TRIG, CHOLHDL No results found for: HGBA1C Lab Results  Component Value Date   VITAMINB12 397 12/20/2014   No results found for: TSH   ASSESSMENT AND PLAN 28 y.o. year old male  has a past medical history of Seizure ([place]) (1993). here with     ICD-10-CM   1. Partial symptomatic epilepsy with complex partial seizures, not intractable, without status epilepticus ([place])  G40.209     Izora Gala is doing very well with current treatment plan.  We will continue Tegretol 600 mg twice daily as well as Vimpat 200 mg twice daily.  Refills have been called into pharmacy.  He was advised to follow-up in 6 months to reassess.  If doing well at that time we will consider annual follow-ups.  Seizure precautions given.  He verbalizes understanding and agreement with this plan.   No orders of the defined types were placed in this encounter.    Meds ordered this encounter  Medications  . carbamazepine (TEGRETOL XR) 200  MG 12 hr tablet    Sig: Take 3 tablets (600 mg total) by mouth 2 (two) times daily. 3  pills twice daily.    Dispense:  120 tablet    Refill:  6    Order Specific Question:   Supervising Provider    Answer:   Anson FretAHERN, ANTONIA B [id number]]  . DISCONTD: lacosamide (VIMPAT) 200 MG TABS tablet    Sig: Take 1 tablet (200 mg total) by mouth 2 (two) times daily.    Dispense:  60 tablet    Refill:  6    Order Specific Question:   Supervising Provider    Answer:   Anson FretAHERN, ANTONIA B [id number]]  . lacosamide (VIMPAT) 200 MG TABS tablet    Sig: Take 1 tablet (200 mg total) by mouth 2 (two) times daily.    Dispense:  60 tablet    Refill:  6    Order Specific Question:   Supervising Provider    Answer:   Anson FretAHERN, ANTONIA B [id number]]      I spent 15 minutes with the patient. 50% of this time was spent counseling and educating patient on plan of care and medications.    Shawnie Dappermy Asaph Serena, FNP-C 04/28/2019, 3:58 PM [facility] [address], [address] [place], [place] [address] [phone]

## 2019-04-28 ENCOUNTER — Encounter: Payer: Self-pay | Admitting: Family Medicine

## 2019-04-28 ENCOUNTER — Other Ambulatory Visit: Payer: Self-pay

## 2019-04-28 ENCOUNTER — Ambulatory Visit: Payer: BC Managed Care – PPO | Admitting: Family Medicine

## 2019-04-28 VITALS — BP 114/86 | HR 77 | Temp 98.0°F | Ht 73.0 in | Wt 202.8 lb

## 2019-04-28 DIAGNOSIS — G40209 Localization-related (focal) (partial) symptomatic epilepsy and epileptic syndromes with complex partial seizures, not intractable, without status epilepticus: Secondary | ICD-10-CM | POA: Diagnosis not present

## 2019-04-28 MED ORDER — CARBAMAZEPINE ER 200 MG PO TB12: 600.0000 mg | ORAL_TABLET | Freq: Two times a day (BID) | ORAL | 6 refills | Status: DC

## 2019-04-28 MED ORDER — LACOSAMIDE 200 MG PO TABS: 200.0000 mg | ORAL_TABLET | Freq: Two times a day (BID) | ORAL | 6 refills | Status: DC

## 2019-05-02 ENCOUNTER — Telehealth: Payer: Self-pay

## 2019-05-02 ENCOUNTER — Other Ambulatory Visit: Payer: Self-pay

## 2019-05-02 MED ORDER — LACOSAMIDE 200 MG PO TABS: 200.0000 mg | ORAL_TABLET | Freq: Two times a day (BID) | ORAL | 5 refills | Status: DC

## 2019-05-02 NOTE — Telephone Encounter (Signed)
I receive fax that pt needs refills on Carbamazepine on [address]. The medication was sent to [facility] on [address] on 04/28/2019 by Amy NP. I called [facility] on  [address] and they stated pts medication is ready for pick up.

## 2019-05-02 NOTE — Telephone Encounter (Signed)
I called pt to find if he receives his refills from [facility] on [address] . Pt stated his medications prescribed by our office are refill by [facility] on [address]. He use to receive refills at the one on [address]. I stated to patient his rx is ready for carbamazepine. Pt verbalized understanding.

## 2019-05-08 NOTE — Progress Notes (Signed)
Made any corrections needed, and agree with history, physical, neuro exam,assessment and plan as stated.     Viren Lebeau, MD [facility]  

## 2019-05-09 ENCOUNTER — Telehealth: Payer: Self-pay

## 2019-05-09 ENCOUNTER — Other Ambulatory Visit: Payer: Self-pay | Admitting: Family Medicine

## 2019-05-09 DIAGNOSIS — G40209 Localization-related (focal) (partial) symptomatic epilepsy and epileptic syndromes with complex partial seizures, not intractable, without status epilepticus: Secondary | ICD-10-CM

## 2019-05-09 MED ORDER — CARBAMAZEPINE ER 200 MG PO TB12: 600.0000 mg | ORAL_TABLET | Freq: Two times a day (BID) | ORAL | 6 refills | Status: DC

## 2019-05-09 NOTE — Telephone Encounter (Signed)
"  Need For Clarification" -[place]. [place]  Pre Drug:  Carbamazepine ER 200MG  Tab.  Pres qty: 120. SIG: Takes 3 tablets by mouth 2x daily.

## 2019-11-02 ENCOUNTER — Ambulatory Visit: Payer: BC Managed Care – PPO | Admitting: Family Medicine

## 2019-11-02 ENCOUNTER — Encounter: Payer: Self-pay | Admitting: Family Medicine

## 2019-11-02 ENCOUNTER — Other Ambulatory Visit: Payer: Self-pay

## 2019-11-02 VITALS — BP 138/79 | HR 75 | Temp 97.4°F | Ht 73.0 in | Wt 209.8 lb

## 2019-11-02 DIAGNOSIS — R569 Unspecified convulsions: Secondary | ICD-10-CM

## 2019-11-02 NOTE — Patient Instructions (Signed)
We will continue current plan  Follow up in 1 year, sooner if needed   Seizure, Adult A seizure is a sudden burst of abnormal electrical activity in the brain. Seizures usually last from 30 seconds to 2 minutes. They can cause many different symptoms. Usually, seizures are not harmful unless they last a long time. What are the causes? Common causes of this condition include:  Fever or infection.  Conditions that affect the brain, such as: ? A brain abnormality that you were born with. ? A brain or head injury. ? Bleeding in the brain. ? A tumor. ? Stroke. ? Brain disorders such as autism or cerebral palsy.  Low blood sugar.  Conditions that are passed from parent to child (are inherited).  Problems with substances, such as: ? Having a reaction to a drug or a medicine. ? Suddenly stopping the use of a substance (withdrawal). In some cases, the cause may not be known. A person who has repeated seizures over time without a clear cause has a condition called epilepsy. What increases the risk? You are more likely to get this condition if you have:  A family history of epilepsy.  Had a seizure in the past.  A brain disorder.  A history of head injury, lack of oxygen at birth, or strokes. What are the signs or symptoms? There are many types of seizures. The symptoms vary depending on the type of seizure you have. Examples of symptoms during a seizure include:  Shaking (convulsions).  Stiffness in the body.  Passing out (losing consciousness).  Head nodding.  Staring.  Not responding to sound or touch.  Loss of bladder control and bowel control. Some people have symptoms right before and right after a seizure happens. Symptoms before a seizure may include:  Fear.  Worry (anxiety).  Feeling like you may vomit (nauseous).  Feeling like the room is spinning (vertigo).  Feeling like you saw or heard something before (dj vu).  Odd tastes or smells.  Changes  in how you see. You may see flashing lights or spots. Symptoms after a seizure happens can include:  Confusion.  Sleepiness.  Headache.  Weakness on one side of the body. How is this treated? Most seizures will stop on their own in under 5 minutes. In these cases, no treatment is needed. Seizures that last longer than 5 minutes will usually need treatment. Treatment can include:  Medicines given through an IV tube.  Avoiding things that are known to cause your seizures. These can include medicines that you take for another condition.  Medicines to treat epilepsy.  Surgery to stop the seizures. This may be needed if medicines do not help. Follow these instructions at home: Medicines  Take over-the-counter and prescription medicines only as told by your doctor.  Do not eat or drink anything that may keep your medicine from working, such as alcohol. Activity  Do not do any activities that would be dangerous if you had another seizure, like driving or swimming. Wait until your doctor says it is safe for you to do them.  If you live in the [place] ask your local DMV (department of motor vehicles) when you can drive.  Get plenty of rest. Teaching others Teach friends and family what to do when you have a seizure. They should:  Lay you on the ground.  Protect your head and body.  Loosen any tight clothing around your neck.  Turn you on your side.  Not hold you down.  Not  put anything into your mouth.  Know whether or not you need emergency care.  Stay with you until you are better.  General instructions  Contact your doctor each time you have a seizure.  Avoid anything that gives you seizures.  Keep a seizure diary. Write down: ? What you think caused each seizure. ? What you remember about each seizure.  Keep all follow-up visits as told by your doctor. This is important. Contact a doctor if:  You have another seizure.  You have seizures more often.  There  is any change in what happens during your seizures.  You keep having seizures with treatment.  You have symptoms of being sick or having an infection. Get help right away if:  You have a seizure that: ? Lasts longer than 5 minutes. ? Is different than seizures you had before. ? Makes it harder to breathe. ? Happens after you hurt your head.  You have any of these symptoms after a seizure: ? Not being able to speak. ? Not being able to use a part of your body. ? Confusion. ? A bad headache.  You have two or more seizures in a row.  You do not wake up right after a seizure.  You get hurt during a seizure. These symptoms may be an emergency. Do not wait to see if the symptoms will go away. Get medical help right away. Call your local emergency services (911 in the [place] Do not drive yourself to the hospital. Summary  Seizures usually last from 30 seconds to 2 minutes. Usually, they are not harmful unless they last a long time.  Do not eat or drink anything that may keep your medicine from working, such as alcohol.  Teach friends and family what to do when you have a seizure.  Contact your doctor each time you have a seizure. This information is not intended to replace advice given to you by your health care provider. Make sure you discuss any questions you have with your health care provider. Document Revised: 11/19/2018 Document Reviewed: 11/19/2018 Elsevier Patient Education  [place].

## 2019-11-02 NOTE — Progress Notes (Addendum)
PATIENT: Robert Byrd DOB: [birth year]  REASON FOR VISIT: follow up HISTORY FROM: patient  Chief Complaint  Patient presents with  . Follow-up    Rm1. alone. No questions nor concerns.     HISTORY OF PRESENT ILLNESS: Today 11/04/19 Robert Byrd is a 29 y.o. male here today for follow up. He is doing well. No seizure activity. He continues to tolerate Vimpat 200mg  BID and Tegretol XR 600mg  BID. No obvious adverse effects. He is expecting his second child as his wife is [redacted] weeks pregnant. He is feeling well today and without concerns.   HISTORY: (copied from my note on 04/28/2019)  Robert Byrd is a 29 y.o. male here today for follow up for seizures. He reported breakthrough seizure thought to be related to increased stress at last visit. Labs show subtherapeutic Vimpat level and dose was increased from 150mg  BID to 200mg  BID. He also takes Tegretol XR 600mg  BID.  He reports that he is tolerating medications well with no obvious adverse effects.  He has had no seizure activity since last being seen.  He is feeling well today and without complaints.  HISTORY: (copied from Betsey Holiday note on 10/28/2018)  UPDATE2/13/2020CMMr.Robert Byrd ,29 year old male returns for follow-up with history of seizure disorder he reports about 3 brief seizures since last seen in April of last year. His dosage was changed in April and said he says it worked well for about 6 to 7 months these have occurred in the last couple of months. One was due to a stressful situation where he had to speak in front of a group. He is currently on Tegretol-XR 200 mg 3 tablets twice daily. He is also on Vimpat 150 twice daily. Denies missing any doses of his medication. He returns for reevaluation.  UPDATE 4/11/19CM Robert Byrd, 29 year old male returns for follow-up with history of seizure disorder. He reports that he has had 4-5 seizures in the last year. He has not kept a diary, one recently when he was in the car  with his wife when she was driving he has missed some doses of his medication. He also reports that he was on narcotics for back pain several months ago. He has had therapeutic levels of his medication in the past. He is currently taking Vimpat 150 twice daily and Tegretol 200 XR 3 in the morning and 2 at night. He returns for reevaluation. He knows he cannot drive for 6 months  UPDATE 04/09/2018CM Robert Byrd 29 year old male returns for follow-up of his wife. He has a history of seizure disorder starting at the age 19. He is currently on Tegretol and Vimpat without side effects. He denies missing any doses of his medication. In the past he has had seizures when missing doses of his medication. He returns for reevaluation. 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  UPDATE 10/09/2017CM Robert Byrd, 29 year old male returns for follow-up he has a history of seizure disorder. He is currently on Tegretol 200 mg 2 in the morning and 3 at night along with Vimpat 150 twice daily. He ran out of his Vimpat Saturday morning and had a seizure Sunday Afternoon while watching football. He has not missed doses of his Tegretol. He returns for reevaluation. He states he wants to have meds increased however the important thing is take the medication as directed and do not run out of medication. He returns for reevaluation  HISTORY AASeizures started at age 9. Complex partial and GTCs. [facility] neurology as a child.  Dr Hyacinth Meeker did an MRI of the brain last when he was [age]. Last EEG when he was 4. Seizures started with staring. He has an aura, starts to feel cold on the inside of his body, he tries to talk a lot, he "passes out". Mother provides most information. He starts salivating, starts moving his tongue around, he can't answer the simplest questions, he starts chewing a lot, staring off. Last GTCS was at age 57, denies missing dosages. He has been on tegretol all his life. Then they added Vimpat. Has had >3  events in the last month, woke up after urinating in the bed. Girlfriend said he had an episode of talking and saying strange things, afterwards is tired and has to sleep. Not missing doses. Previous to this, he has been controlled ok, maybe one seizure a month. Recently there was an error in his medications, he was on 100mg  vimpat bid and was changed to 50mg  bid and maybe that increased the seizures however was still having them once a month previously.   Also having daily pressure type headaches. Worsening. No weakness. No focal neurologic symptoms. Takes OTC medications daily.    REVIEW OF SYSTEMS: Out of a complete 14 system review of symptoms, the patient complains only of the following symptoms, none and all other reviewed systems are negative.  ALLERGIES: No Known Allergies  HOME MEDICATIONS: Outpatient Medications Prior to Visit  Medication Sig Dispense Refill  . carbamazepine (TEGRETOL XR) 200 MG 12 hr tablet Take 3 tablets (600 mg total) by mouth 2 (two) times daily. 3 pills twice daily. 120 tablet 6  . Fluticasone-Salmeterol (ADVAIR) 250-50 MCG/DOSE AEPB Inhale 1 puff into the lungs 2 (two) times daily.    lacosamide (VIMPAT) 200 MG TABS tablet Take 1 tablet (200 mg total) by mouth 2 (two) times daily. 60 tablet 5  . montelukast (SINGULAIR) 10 MG tablet Take 10 mg by mouth at bedtime.     No facility-administered medications prior to visit.    PAST MEDICAL HISTORY: Past Medical History:  Diagnosis Date  . Seizure (HCC) 1993    PAST SURGICAL HISTORY: No past surgical history on file.  FAMILY HISTORY: Family History  Problem Relation Age of Onset  . Hypertension Maternal Grandmother   . Diabetes Maternal Grandfather     SOCIAL HISTORY: Social History   Socioeconomic History  . Marital status: Married    Spouse name:  . Number of children: 1  . Years of education: Bachelor's  . Highest education level: Not on file  Occupational History  . Occupation:  [employer]  Tobacco Use  . Smoking status: Never Smoker  . Smokeless tobacco: Never Used  Substance and Sexual Activity  . Alcohol use: No    Alcohol/week: 0.0 standard drinks  . Drug use: No  . Sexual activity: Not on file  Other Topics Concern  . Not on file  Social History Narrative   Live at with home with wife, child.   Right handed.   Caffeine use: Drinks soda occassionally    Social Determinants of Health   Financial Resource Strain:   . Difficulty of Paying Living Expenses: Not on file  Food Insecurity:   . Worried About Sue Lush in the Last Year: Not on file  . Ran Out of Food in the Last Year: Not on file  Transportation Needs:   . Lack of Transportation (Medical): Not on file  . Lack of Transportation (Non-Medical): Not on file  Physical Activity:   . Days of Exercise per Week: Not on file  . Minutes of Exercise per Session: Not on file  Stress:   . Feeling of Stress : Not on file  Social Connections:   . Frequency of Communication with Friends and Family: Not on file  . Frequency of Social Gatherings with Friends and Family: Not on file  . Attends Religious Services: Not on file  . Active Member of Clubs or Organizations: Not on file  . Attends Archivist Meetings: Not on file  . Marital Status: Not on file  Intimate Partner Violence:   . Fear of Current or Ex-Partner: Not on file  . Emotionally Abused: Not on file  . Physically Abused: Not on file  . Sexually Abused: Not on file      PHYSICAL EXAM  Vitals:   11/02/19 1536  BP: 138/79  Pulse: 75  Temp: (!) 97.4 F (36.3 C)  Weight: 209 lb 12.8 oz (95.2 kg)  Height: 6\' 1"  (1.854 m)   Body mass index is 27.68 kg/m.  Generalized: Well developed, in no acute distress  Cardiology: normal rate and rhythm, no murmur noted Neurological examination  Mentation: Alert oriented to time, place, history taking. Follows all commands speech and language fluent Cranial nerve  II-XII: Pupils were equal round reactive to light. Extraocular movements were full, visual field were full on confrontational test. Facial sensation and strength were normal. Uvula tongue midline. Head turning and shoulder shrug  were normal and symmetric. Motor: The motor testing reveals 5 over 5 strength of all 4 extremities. Good symmetric motor tone is noted throughout.  Sensory: Sensory testing is intact to soft touch on all 4 extremities. No evidence of extinction is noted.  Coordination: Cerebellar testing reveals good finger-nose-finger and heel-to-shin bilaterally.  Gait and station: Gait is normal.   DIAGNOSTIC DATA (LABS, IMAGING, TESTING) - I reviewed patient records, labs, notes, testing and imaging myself where available.  No flowsheet data found.   Lab Results  Component Value Date   WBC 2.8 (L) 10/28/2018   HGB 14.0 10/28/2018   HCT 39.9 10/28/2018   MCV 86 10/28/2018   PLT 242 10/28/2018      Component Value Date/Time   NA 143 10/28/2018 1423   K 4.1 10/28/2018 1423   CL 100 10/28/2018 1423   CO2 27 10/28/2018 1423   GLUCOSE 87 10/28/2018 1423   BUN 12 10/28/2018 1423   CREATININE 0.99 10/28/2018 1423   CALCIUM 9.8 10/28/2018 1423   PROT 7.8 10/28/2018 1423   ALBUMIN 4.8 10/28/2018 1423   AST 18 10/28/2018 1423   ALT 21 10/28/2018 1423   ALKPHOS 88 10/28/2018 1423   BILITOT <0.2 10/28/2018 1423   GFRNONAA 102 10/28/2018 1423   GFRAA 118 10/28/2018 1423   No results found for: CHOL, HDL, LDLCALC, LDLDIRECT, TRIG, CHOLHDL No results found for: HGBA1C Lab Results  Component Value Date   VITAMINB12 397 12/20/2014   No results found for: TSH     ASSESSMENT AND PLAN 29 y.o. year old male  has a past medical history of Seizure ([place]) (1993). here with     ICD-10-CM   1. Seizures ([place])  R56.9     He is doing well. No recent seizure activity. He is tolerating Vimpat and Tegretol as prescribed. We will continue current plan. Labs were unremarkable at last  visit. He was advised to continue healthy lifestyle habits. Adequate hydration advised. He will follow up in 1 year,  sooner if needed. He verbalizes understanding and agreement with this plan.    No orders of the defined types were placed in this encounter.    No orders of the defined types were placed in this encounter.     I spent 20 minutes with the patient. 50% of this time was spent counseling and educating patient on plan of care and medications.    [name], FNP-C 11/04/2019, 2:32 PM [facility] [address] [place], [place] [address] [phone]  Made any corrections needed, and agree with history, physical, neuro exam,assessment and plan as stated.     Naomie Dean, MD [facility]

## 2019-11-04 ENCOUNTER — Encounter: Payer: Self-pay | Admitting: Family Medicine

## 2019-11-14 ENCOUNTER — Telehealth: Payer: Self-pay | Admitting: Family Medicine

## 2019-11-14 DIAGNOSIS — G40209 Localization-related (focal) (partial) symptomatic epilepsy and epileptic syndromes with complex partial seizures, not intractable, without status epilepticus: Secondary | ICD-10-CM

## 2019-11-14 MED ORDER — CARBAMAZEPINE ER 200 MG PO TB12: 600.0000 mg | ORAL_TABLET | Freq: Two times a day (BID) | ORAL | 6 refills | Status: DC

## 2019-11-14 NOTE — Addendum Note (Signed)
Addended by: [place] Pink on: 11/14/2019 05:01 PM   Modules accepted: Orders

## 2019-11-14 NOTE — Telephone Encounter (Signed)
Pt has called to report that since his appointment he has been waiting on these medications(carbamazepine (TEGRETOL XR) 200 MG 12 hr tablet &  lacosamide (VIMPAT) 200 MG TABS tablet) to be called into  [facility] [phone] please have filled

## 2019-11-15 MED ORDER — LACOSAMIDE 200 MG PO TABS: 200.0000 mg | ORAL_TABLET | Freq: Two times a day (BID) | ORAL | 5 refills | Status: DC

## 2019-11-17 NOTE — Telephone Encounter (Signed)
Pt called in regards to his vimpat states he was under the impression he would he a year supply of refills and only received 6 months.

## 2019-11-21 NOTE — Telephone Encounter (Signed)
I called pt and relayed that vimpat controlled medication, and is only allowed every 6 months. Pt to reach back to [place] or pharmacy to refill in 6 months.   He verbalized understanding.

## 2019-12-30 ENCOUNTER — Ambulatory Visit: Payer: 59 | Attending: Internal Medicine

## 2019-12-30 DIAGNOSIS — Z23 Encounter for immunization: Secondary | ICD-10-CM

## 2019-12-30 NOTE — Progress Notes (Signed)
   Covid-19 Vaccination Clinic  Name:  Robert Byrd    MRN: [id number] DOB: 06-Jul-1991  12/30/2019  Robert Byrd was observed post Covid-19 immunization for 15 minutes without incident. He was provided with Vaccine Information Sheet and instruction to access the V-Safe system.   Robert Byrd was instructed to call 911 with any severe reactions post vaccine: [place] [employer] Difficulty breathing  . Swelling of face and throat  . A fast heartbeat  . A bad rash all over body  . Dizziness and weakness   Immunizations Administered    Name Date Dose VIS Date Route   Pfizer COVID-19 Vaccine 12/30/2019 11:41 AM 0.3 mL 08/26/2019 Intramuscular   Manufacturer: [employer], [employer]   Lot: [id number]   NDC: [address]-3

## 2020-01-24 ENCOUNTER — Ambulatory Visit: Payer: 59 | Attending: Internal Medicine

## 2020-01-24 DIAGNOSIS — Z23 Encounter for immunization: Secondary | ICD-10-CM

## 2020-01-24 NOTE — Progress Notes (Signed)
   Covid-19 Vaccination Clinic  Name:  Robert Byrd    MRN: [id number] DOB: [birth year]  01/24/2020  [name] was observed post Covid-19 immunization for 15 minutes without incident. He was provided with Vaccine Information Sheet and instruction to access the V-Safe system.   [name] was instructed to call 911 with any severe reactions post vaccine: [employer] [employer] Difficulty breathing  . Swelling of face and throat  . A fast heartbeat  . A bad rash all over body  . Dizziness and weakness   Immunizations Administered    Name Date Dose VIS Date Route   Pfizer COVID-19 Vaccine 01/24/2020 11:32 AM 0.3 mL 11/09/2018 Intramuscular   Manufacturer: [employer], [employer]   Lot: [id number]   NDC: [address]-5

## 2020-05-14 ENCOUNTER — Other Ambulatory Visit: Payer: Self-pay | Admitting: Family Medicine

## 2020-05-14 DIAGNOSIS — G40209 Localization-related (focal) (partial) symptomatic epilepsy and epileptic syndromes with complex partial seizures, not intractable, without status epilepticus: Secondary | ICD-10-CM

## 2020-05-14 MED ORDER — CARBAMAZEPINE ER 200 MG PO TB12: 600.0000 mg | ORAL_TABLET | Freq: Two times a day (BID) | ORAL | 5 refills | Status: DC

## 2020-05-14 MED ORDER — LACOSAMIDE 200 MG PO TABS: 200.0000 mg | ORAL_TABLET | Freq: Two times a day (BID) | ORAL | 5 refills | Status: DC

## 2020-05-14 NOTE — Addendum Note (Signed)
Addended by: Guy Begin on: 05/14/2020 03:25 PM   Modules accepted: Orders

## 2020-05-14 NOTE — Telephone Encounter (Signed)
Pt request refills lacosamide (VIMPAT) 200 MG TABS tablet and carbamazepine (TEGRETOL XR) 200 MG 12 hr tablet at [facility] [phone]

## 2020-05-22 ENCOUNTER — Telehealth: Payer: Self-pay

## 2020-05-22 NOTE — Telephone Encounter (Signed)
Pt said he has discussed his medications already and has no additional questions.

## 2020-05-22 NOTE — Telephone Encounter (Signed)
Pt left a VM on 05/14/2020 asking for a call to discuss his medications and refills.

## 2020-10-29 ENCOUNTER — Encounter: Payer: Self-pay | Admitting: Family Medicine

## 2020-10-29 ENCOUNTER — Ambulatory Visit: Payer: BC Managed Care – PPO | Admitting: Family Medicine

## 2020-10-29 VITALS — BP 131/82 | HR 71 | Ht 73.0 in | Wt 206.0 lb

## 2020-10-29 DIAGNOSIS — G40209 Localization-related (focal) (partial) symptomatic epilepsy and epileptic syndromes with complex partial seizures, not intractable, without status epilepticus: Secondary | ICD-10-CM

## 2020-10-29 MED ORDER — CARBAMAZEPINE ER 200 MG PO TB12: 600.0000 mg | ORAL_TABLET | Freq: Two times a day (BID) | ORAL | 1 refills | Status: DC

## 2020-10-29 MED ORDER — LACOSAMIDE 200 MG PO TABS: 200.0000 mg | ORAL_TABLET | Freq: Two times a day (BID) | ORAL | 1 refills | Status: DC

## 2020-10-29 NOTE — Patient Instructions (Addendum)
Below is our plan:  We will continue lacosamide 200mg  and carbamazepine 600mg  twice daily. Please monitor aura symptoms closely. Let me know if they worsen or any concerns of seizure activity.   Please make sure you are staying well hydrated. I recommend 50-60 ounces daily. Well balanced diet and regular exercise encouraged. Consistent sleep schedule with 6-8 hours recommended.   Please continue follow up with care team as directed.   Follow up in 1 year, sooner if needed.   You may receive a survey regarding today's visit. I encourage you to leave honest feed back as I do use this information to improve patient care. Thank you for seeing me today!      Seizure, Adult A seizure is a sudden burst of abnormal electrical and chemical activity in the brain. Seizures usually last from 30 seconds to 2 minutes.  What are the causes? Common causes of this condition include:  Fever or infection.  Problems that affect the brain. These may include: ? A brain or head injury. ? Bleeding in the brain. ? A brain tumor.  Low levels of blood sugar or salt.  Kidney problems or liver problems.  Conditions that are passed from parent to child (are inherited).  Problems with a substance, such as: ? Having a reaction to a drug or a medicine. ? Stopping the use of a substance all of a sudden (withdrawal).  A stroke.  Disorders that affect how you develop. Sometimes, the cause may not be known.  What increases the risk?  Having someone in your family who has epilepsy. In this condition, seizures happen again and again over time. They have no clear cause.  Having had a tonic-clonic seizure before. This type of seizure causes you to: ? Tighten the muscles of the whole body. ? Lose consciousness.  Having had a head injury or strokes before.  Having had a lack of oxygen at birth. What are the signs or symptoms? There are many types of seizures. The symptoms vary depending on the type of  seizure you have. Symptoms during a seizure  Shaking that you cannot control (convulsions) with fast, jerky movements of muscles.  Stiffness of the body.  Breathing problems.  Feeling mixed up (confused).  Staring or not responding to sound or touch.  Head nodding.  Eyes that blink, flutter, or move fast.  Drooling, grunting, or making clicking sounds with your mouth  Losing control of when you pee or poop. Symptoms before a seizure  Feeling afraid, nervous, or worried.  Feeling like you may vomit.  Feeling like: ? You are moving when you are not. ? Things around you are moving when they are not.  Feeling like you saw or heard something before (dj vu).  Odd tastes or smells.  Changes in how you see. You may see flashing lights or spots. Symptoms after a seizure  Feeling confused.  Feeling sleepy.  Headache.  Sore muscles. How is this treated? If your seizure stops on its own, you will not need treatment. If your seizure lasts longer than 5 minutes, you will normally need treatment. Treatment may include:  Medicines given through an IV tube.  Avoiding things, such as medicines, that are known to cause your seizures.  Medicines to prevent seizures.  A device to prevent or control seizures.  Surgery.  A diet low in carbohydrates and high in fat (ketogenic diet). Follow these instructions at home: Medicines  Take over-the-counter and prescription medicines only as told by your doctor.  Avoid foods or drinks that may keep your medicine from working, such as alcohol. Activity  Follow instructions about driving, swimming, or doing things that would be dangerous if you had another seizure. Wait until your doctor says it is safe for you to do these things.  If you live in the [place] ask your local department of motor vehicles when you can drive.  Get a lot of rest. Teaching others  Teach friends and family what to do when you have a seizure. They  should: ? Help you get down to the ground. ? Protect your head and body. ? Loosen any clothing around your neck. ? Turn you on your side. ? Know whether or not you need emergency care. ? Stay with you until you are better.  Also, tell them what not to do if you have a seizure. Tell them: ? They should not hold you down. ? They should not put anything in your mouth.   General instructions  Avoid anything that gives you seizures.  Keep a seizure diary. Write down: ? What you remember about each seizure. ? What you think caused each seizure.  Keep all follow-up visits. Contact a doctor if:  You have another seizure or seizures. Call the doctor each time you have a seizure.  The pattern of your seizures changes.  You keep having seizures with treatment.  You have symptoms of being sick or having an infection.  You are not able to take your medicine. Get help right away if:  You have any of these problems: ? A seizure that lasts longer than 5 minutes. ? Many seizures in a row and you do not feel better between seizures. ? A seizure that makes it harder to breathe. ? A seizure and you can no longer speak or use part of your body.  You do not wake up right after a seizure.  You get hurt during a seizure.  You feel confused or have pain right after a seizure. These symptoms may be an emergency. Get help right away. Call your local emergency services (911 in the [place]  Do not wait to see if the symptoms will go away.  Do not drive yourself to the hospital. Summary  A seizure is a sudden burst of abnormal electrical and chemical activity in the brain. Seizures normally last from 30 seconds to 2 minutes.  Causes of seizures include illness, injury to the head, low levels of blood sugar or salt, and certain conditions.  Most seizures will stop on their own in less than 5 minutes. Seizures that last longer than 5 minutes are a medical emergency and need treatment right  away.  Many medicines are used to treat seizures. Take over-the-counter and prescription medicines only as told by your doctor. This information is not intended to replace advice given to you by your health care provider. Make sure you discuss any questions you have with your health care provider. Document Revised: 03/09/2020 Document Reviewed: 03/09/2020 Elsevier Patient Education  2021 [employer].

## 2020-10-29 NOTE — Progress Notes (Addendum)
PATIENT: Robert Byrd DOB: 03/31/1991  REASON FOR VISIT: follow up HISTORY FROM: patient  Chief Complaint  Patient presents with  . Follow-up    RM 1 alone  Pt is well, doesn't think he has had a seizure In last year but, does have them in his sleep at times.     HISTORY OF PRESENT ILLNESS: 10/29/20  Robert Byrd returns today for seizure follow up. He continues lacosamide 200mg  and carbamazepine 600mg  BID. He is tolerating medicaitons well with no seizure activity. He does report more auras over the past 1-2 years. He has experienced symptoms of feeling cold all over and feeling anxious prior to seizures in the past. These symptoms usually get better when dose changes are made but return at some point, to varying degrees. No seizure activity has followed aura. Dose change last in 2020 with increased dose of lacosamide due to subtherapeutic levels. Last seizure 10/2018. He is sleeping well. No obvious changes in stress levels. His wife delivered their second son about 6 months ago. He denies missed doses of AEDs.    11/02/2019 ALL:  Robert Byrd is a 30 y.o. male here today for follow up. He is doing well. No seizure activity. He continues to tolerate Vimpat 200mg  BID and Tegretol XR 600mg  BID. No obvious adverse effects. He is expecting his second child as his wife is [redacted] weeks pregnant. He is feeling well today and without concerns.    HISTORY: (copied from my note on 04/28/2019)  Robert Byrd is a 30 y.o. male here today for follow up for seizures. He reported breakthrough seizure thought to be related to increased stress at last visit. Labs show subtherapeutic Vimpat level and dose was increased from 150mg  BID to 200mg  BID. He also takes Tegretol XR 600mg  BID.  He reports that he is tolerating medications well with no obvious adverse effects.  He has had no seizure activity since last being seen.  He is feeling well today and without complaints.  HISTORY: (copied from  note on 10/28/2018)  UPDATE2/13/2020CMMr.Robert Byrd ,30 year old male returns for follow-up with history of seizure disorder he reports about 3 brief seizures since last seen in April of last year. His dosage was changed in April and said he says it worked well for about 6 to 7 months these have occurred in the last couple of months. One was due to a stressful situation where he had to speak in front of a group. He is currently on Tegretol-XR 200 mg 3 tablets twice daily. He is also on Vimpat 150 twice daily. Denies missing any doses of his medication. He returns for reevaluation.  UPDATE 4/11/19CM [name], 30 year old male returns for follow-up with history of seizure disorder. He reports that he has had 4-5 seizures in the last year. He has not kept a diary, one recently when he was in the car with his wife when she was driving he has missed some doses of his medication. He also reports that he was on narcotics for back pain several months ago. He has had therapeutic levels of his medication in the past. He is currently taking Vimpat 150 twice daily and Tegretol 200 XR 3 in the morning and 2 at night. He returns for reevaluation. He knows he cannot drive for 6 months  UPDATE 04/09/2018CM [name] 30 year old male returns for follow-up of his wife. He has a history of seizure disorder starting at the age 43. He is currently on Tegretol and Vimpat without side effects.  He denies missing any doses of his medication. In the past he has had seizures when missing doses of his medication. He returns for reevaluation. 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  UPDATE 10/09/2017CM [name], 30 year old male returns for follow-up he has a history of seizure disorder. He is currently on Tegretol 200 mg 2 in the morning and 3 at night along with Vimpat 150 twice daily. He ran out of his Vimpat Saturday morning and had a seizure Sunday Afternoon while watching football. He has not missed doses  of his Tegretol. He returns for reevaluation. He states he wants to have meds increased however the important thing is take the medication as directed and do not run out of medication. He returns for reevaluation  HISTORY AASeizures started at age 70. Complex partial and GTCs. [facility] neurology as a child. Dr Hyacinth Meeker did an MRI of the brain last when he was [age]. Last EEG when he was 4. Seizures started with staring. He has an aura, starts to feel cold on the inside of his body, he tries to talk a lot, he "passes out". Mother provides most information. He starts salivating, starts moving his tongue around, he can't answer the simplest questions, he starts chewing a lot, staring off. Last GTCS was at age 70, denies missing dosages. He has been on tegretol all his life. Then they added Vimpat. Has had >3 events in the last month, woke up after urinating in the bed. Girlfriend said he had an episode of talking and saying strange things, afterwards is tired and has to sleep. Not missing doses. Previous to this, he has been controlled ok, maybe one seizure a month. Recently there was an error in his medications, he was on 100mg  vimpat bid and was changed to 50mg  bid and maybe that increased the seizures however was still having them once a month previously.   Also having daily pressure type headaches. Worsening. No weakness. No focal neurologic symptoms. Takes OTC medications daily.    REVIEW OF SYSTEMS: Out of a complete 14 system review of symptoms, the patient complains only of the following symptoms, auras and all other reviewed systems are negative.  ALLERGIES: No Known Allergies  HOME MEDICATIONS: Outpatient Medications Prior to Visit  Medication Sig Dispense Refill  . Fluticasone-Salmeterol (ADVAIR) 250-50 MCG/DOSE AEPB Inhale 1 puff into the lungs 2 (two) times daily.    . montelukast (SINGULAIR) 10 MG tablet Take 10 mg by mouth at bedtime.    . carbamazepine (TEGRETOL XR) 200 MG 12 hr tablet Take  3 tablets (600 mg total) by mouth 2 (two) times daily. 3 pills twice daily. 180 tablet 5  . lacosamide (VIMPAT) 200 MG TABS tablet Take 1 tablet (200 mg total) by mouth 2 (two) times daily. 60 tablet 5   No facility-administered medications prior to visit.    PAST MEDICAL HISTORY: Past Medical History:  Diagnosis Date  . Seizure (HCC) 1993    PAST SURGICAL HISTORY: History reviewed. No pertinent surgical history.  FAMILY HISTORY: Family History  Problem Relation Age of Onset  . Hypertension Maternal Grandmother   . Diabetes Maternal Grandfather     SOCIAL HISTORY: Social History   Socioeconomic History  . Marital status: Married    Spouse name:  . Number of children: 1  . Years of education: Bachelor's  . Highest education level: Not on file  Occupational History  . Occupation: [employer] of  Tobacco Use  . Smoking status: Never Smoker  .  Smokeless tobacco: Never Used  Substance and Sexual Activity  . Alcohol use: No    Alcohol/week: 0.0 standard drinks  . Drug use: No  . Sexual activity: Not on file  Other Topics Concern  . Not on file  Social History Narrative   Live at with home with wife, child.   Right handed.   Caffeine use: Drinks soda occassionally    Social Determinants of Corporate investment banker Strain: Not on file  Food Insecurity: Not on file  Transportation Needs: Not on file  Physical Activity: Not on file  Stress: Not on file  Social Connections: Not on file  Intimate Partner Violence: Not on file      PHYSICAL EXAM  Vitals:   10/29/20 1518  BP: 131/82  Pulse: 71  Weight: 206 lb (93.4 kg)  Height: 6\' 1"  (1.854 m)   Body mass index is 27.18 kg/m.  Generalized: Well developed, in no acute distress  Cardiology: normal rate and rhythm, no murmur noted Neurological examination  Mentation: Alert oriented to time, place, history taking. Follows all commands speech and language fluent Cranial nerve II-XII: Pupils were  equal round reactive to light. Extraocular movements were full, visual field were full on confrontational test. Facial sensation and strength were normal. Head turning and shoulder shrug  were normal and symmetric. Motor: The motor testing reveals 5 over 5 strength of all 4 extremities. Good symmetric motor tone is noted throughout.  Sensory: Sensory testing is intact to soft touch on all 4 extremities. No evidence of extinction is noted.  Coordination: Cerebellar testing reveals good finger-nose-finger and heel-to-shin bilaterally.  Gait and station: Gait is normal.   DIAGNOSTIC DATA (LABS, IMAGING, TESTING) - I reviewed patient records, labs, notes, testing and imaging myself where available.  No flowsheet data found.   Lab Results  Component Value Date   WBC 2.8 (L) 10/28/2018   HGB 14.0 10/28/2018   HCT 39.9 10/28/2018   MCV 86 10/28/2018   PLT 242 10/28/2018      Component Value Date/Time   NA 143 10/28/2018 1423   K 4.1 10/28/2018 1423   CL 100 10/28/2018 1423   CO2 27 10/28/2018 1423   GLUCOSE 87 10/28/2018 1423   BUN 12 10/28/2018 1423   CREATININE 0.99 10/28/2018 1423   CALCIUM 9.8 10/28/2018 1423   PROT 7.8 10/28/2018 1423   ALBUMIN 4.8 10/28/2018 1423   AST 18 10/28/2018 1423   ALT 21 10/28/2018 1423   ALKPHOS 88 10/28/2018 1423   BILITOT <0.2 10/28/2018 1423   GFRNONAA 102 10/28/2018 1423   GFRAA 118 10/28/2018 1423   No results found for: CHOL, HDL, LDLCALC, LDLDIRECT, TRIG, CHOLHDL No results found for: 10/30/2018 Lab Results  Component Value Date   VITAMINB12 397 12/20/2014   No results found for: TSH     ASSESSMENT AND PLAN 30 y.o. year old male  has a past medical history of Seizure (HCC) (1993). here with     ICD-10-CM   1. Partial symptomatic epilepsy with complex partial seizures, not intractable, without status epilepticus (HCC)  G40.209 CBC with Differential/Platelets    CMP    Carbamazepine level, total    Lacosamide    lacosamide (VIMPAT)  200 MG TABS tablet    carbamazepine (TEGRETOL XR) 200 MG 12 hr tablet    He is doing fairly well, today. No recent seizure activity. He has noted more aura symptoms over the past 1-2 years, occurring about once monthly.  He is tolerating  lacosamide and carbamazepine as prescribed. We have discussed increasing dose of lacosamide, however, he is hesitant to make changes. I will update labs today to ensure adequate level of AED. Will discuss changes in regimen, again, following results. Seizure precautions advised. He was advised to continue healthy lifestyle habits. Adequate hydration advised. He will follow up in 1 year, sooner if needed. He verbalizes understanding and agreement with this plan.    Orders Placed This Encounter  Procedures  . CBC with Differential/Platelets  . CMP  . Carbamazepine level, total  . Lacosamide     Meds ordered this encounter  Medications  . lacosamide (VIMPAT) 200 MG TABS tablet    Sig: Take 1 tablet (200 mg total) by mouth 2 (two) times daily.    Dispense:  180 tablet    Refill:  1    Order Specific Question:   Supervising Provider    Answer:   Anson FretAHERN, ANTONIA B [id number]]  . carbamazepine (TEGRETOL XR) 200 MG 12 hr tablet    Sig: Take 3 tablets (600 mg total) by mouth 2 (two) times daily. 3 pills twice daily.    Dispense:  540 tablet    Refill:  1    Order Specific Question:   Supervising Provider    Answer:   Anson FretHERN, ANTONIA B [id number]]      I spent 20 minutes with the patient. 50% of this time was spent counseling and educating patient on plan of care and medications.     Shawnie Dappermy Viet Kemmerer, FNP-C 10/29/2020, 3:57 PM [facility] [address], [address] [place] [address] [phone]   Made any corrections needed, and agree with history, physical, neuro exam,assessment and plan as stated.     Naomie DeanAntonia Ahern, MD [facility]

## 2020-11-01 ENCOUNTER — Telehealth: Payer: Self-pay

## 2020-11-01 LAB — COMPREHENSIVE METABOLIC PANEL
ALT: 27 IU/L (ref 0–44)
AST: 20 IU/L (ref 0–40)
Albumin/Globulin Ratio: 1.6 (ref 1.2–2.2)
Albumin: 4.9 g/dL (ref 4.0–5.0)
Alkaline Phosphatase: 86 IU/L (ref 44–121)
BUN/Creatinine Ratio: 11 (ref 9–20)
BUN: 13 mg/dL (ref 6–20)
Bilirubin Total: <0.2 mg/dL (ref 0.0–1.2)
CO2: 23 mmol/L (ref 20–29)
Calcium: 9.8 mg/dL (ref 8.7–10.2)
Chloride: 102 mmol/L (ref 96–106)
Creatinine, Ser: 1.14 mg/dL (ref 0.76–1.27)
GFR calc Af Amer: 99 mL/min/{1.73_m2} (ref >59)
GFR calc non Af Amer: 85 mL/min/{1.73_m2} (ref >59)
Globulin, Total: 3.1 g/dL (ref 1.5–4.5)
Glucose: 86 mg/dL (ref 65–99)
Potassium: 4.1 mmol/L (ref 3.5–5.2)
Sodium: 141 mmol/L (ref 134–144)
Total Protein: 8 g/dL (ref 6.0–8.5)

## 2020-11-01 LAB — CBC WITH DIFFERENTIAL/PLATELET
Basophils Absolute: 0 10*3/uL (ref 0.0–0.2)
Basos: 0 %
EOS (ABSOLUTE): 0 10*3/uL (ref 0.0–0.4)
Eos: 1 %
Hematocrit: 41.3 % (ref 37.5–51.0)
Hemoglobin: 14.5 g/dL (ref 13.0–17.7)
Immature Grans (Abs): 0 10*3/uL (ref 0.0–0.1)
Immature Granulocytes: 0 %
Lymphocytes Absolute: 1.9 10*3/uL (ref 0.7–3.1)
Lymphs: 65 %
MCH: 30.6 pg (ref 26.6–33.0)
MCHC: 35.1 g/dL (ref 31.5–35.7)
MCV: 87 fL (ref 79–97)
Monocytes Absolute: 0.2 10*3/uL (ref 0.1–0.9)
Monocytes: 7 %
Neutrophils Absolute: 0.8 10*3/uL — ABNORMAL LOW (ref 1.4–7.0)
Neutrophils: 27 %
Platelets: 264 10*3/uL (ref 150–450)
RBC: 4.74 x10E6/uL (ref 4.14–5.80)
RDW: 12.2 % (ref 11.6–15.4)
WBC: 2.9 10*3/uL — ABNORMAL LOW (ref 3.4–10.8)

## 2020-11-01 LAB — LACOSAMIDE: Lacosamide: 5.2 ug/mL (ref 5.0–10.0)

## 2020-11-01 LAB — CARBAMAZEPINE LEVEL, TOTAL: Carbamazepine (Tegretol), S: 9.9 ug/mL (ref 4.0–12.0)

## 2020-11-01 NOTE — Telephone Encounter (Signed)
I called the Pt to discuss lab results, I informed him that his labs are stable and to continue with current treatment plan per Amy. I advised to him to call the office with further questions, he understood.

## 2020-11-05 ENCOUNTER — Ambulatory Visit: Payer: BC Managed Care – PPO | Admitting: Family Medicine

## 2021-04-08 ENCOUNTER — Other Ambulatory Visit: Payer: Self-pay | Admitting: *Deleted

## 2021-04-08 DIAGNOSIS — G40209 Localization-related (focal) (partial) symptomatic epilepsy and epileptic syndromes with complex partial seizures, not intractable, without status epilepticus: Secondary | ICD-10-CM

## 2021-04-08 MED ORDER — LACOSAMIDE 200 MG PO TABS: 200.0000 mg | ORAL_TABLET | Freq: Two times a day (BID) | ORAL | 1 refills | Status: DC

## 2021-04-15 ENCOUNTER — Other Ambulatory Visit: Payer: Self-pay

## 2021-04-15 DIAGNOSIS — G40209 Localization-related (focal) (partial) symptomatic epilepsy and epileptic syndromes with complex partial seizures, not intractable, without status epilepticus: Secondary | ICD-10-CM

## 2021-04-15 MED ORDER — CARBAMAZEPINE ER 200 MG PO TB12: 600.0000 mg | ORAL_TABLET | Freq: Two times a day (BID) | ORAL | 1 refills | Status: DC

## 2021-05-06 ENCOUNTER — Other Ambulatory Visit: Payer: Self-pay | Admitting: Family Medicine

## 2021-05-06 DIAGNOSIS — G40209 Localization-related (focal) (partial) symptomatic epilepsy and epileptic syndromes with complex partial seizures, not intractable, without status epilepticus: Secondary | ICD-10-CM

## 2021-05-06 MED ORDER — LACOSAMIDE 200 MG PO TABS: 200.0000 mg | ORAL_TABLET | Freq: Two times a day (BID) | ORAL | 1 refills | Status: DC

## 2021-05-06 NOTE — Telephone Encounter (Signed)
Pt called requesting refill for lacosamide (VIMPAT) 200 MG TABS tablet. Pharmacy [facility] [phone].

## 2021-08-26 ENCOUNTER — Telehealth: Payer: Self-pay | Admitting: Family Medicine

## 2021-08-26 NOTE — Telephone Encounter (Signed)
Pt requesting refill for lacosamide (VIMPAT) 200 MG TABS tablet. Pharmacy [id number] - [place], [place] - [address]

## 2021-08-26 NOTE — Telephone Encounter (Signed)
Called and spoke w/ Trinna Post from pharmacy. Patient just picked up #180 (90 days supply) 07/29/21. Too soon to send in refill right now. Pt should request closer to when he is due for refill.   I called pt to let him know. He verbalized understanding.

## 2021-10-30 ENCOUNTER — Encounter: Payer: Self-pay | Admitting: Family Medicine

## 2021-10-30 ENCOUNTER — Ambulatory Visit: Payer: BC Managed Care – PPO | Admitting: Family Medicine

## 2021-10-30 VITALS — BP 132/82 | HR 75 | Ht 73.0 in | Wt 207.0 lb

## 2021-10-30 DIAGNOSIS — G40209 Localization-related (focal) (partial) symptomatic epilepsy and epileptic syndromes with complex partial seizures, not intractable, without status epilepticus: Secondary | ICD-10-CM

## 2021-10-30 MED ORDER — LACOSAMIDE 200 MG PO TABS: 200.0000 mg | ORAL_TABLET | Freq: Two times a day (BID) | ORAL | 1 refills | Status: DC

## 2021-10-30 MED ORDER — CARBAMAZEPINE ER 200 MG PO TB12: 600.0000 mg | ORAL_TABLET | Freq: Two times a day (BID) | ORAL | 3 refills | Status: DC

## 2021-10-30 NOTE — Patient Instructions (Addendum)
Below is our plan:  We will continue lacosamide 200mg  and carbamazepine 600mg  twice daily.   Please make sure you are staying well hydrated. I recommend 50-60 ounces daily. Well balanced diet and regular exercise encouraged. Consistent sleep schedule with 6-8 hours recommended.   Please continue follow up with care team as directed.   Follow up with me in 3-4 months   According to [place] law, you can not drive unless you are seizure / syncope free for at least 6 months and under physician's care.  Please maintain precautions. Do not participate in activities where a loss of awareness could harm you or someone else. No swimming alone, no tub bathing, no hot tubs, no driving, no operating motorized vehicles (cars, ATVs, motocycles, etc), lawnmowers, power tools or firearms. No standing at heights, such as rooftops, ladders or stairs. Avoid hot objects such as stoves, heaters, open fires. Wear a helmet when riding a bicycle, scooter, skateboard, etc. and avoid areas of traffic. Set your water heater to 120 degrees or less.     You may receive a survey regarding today's visit. I encourage you to leave honest feed back as I do use this information to improve patient care. Thank you for seeing me today!

## 2021-10-30 NOTE — Progress Notes (Signed)
PATIENT: Robert Byrd DOB: 1991-04-30  REASON FOR VISIT: follow up HISTORY FROM: patient  Chief Complaint  Patient presents with   Follow-up    Rm 1, w wife. Here for yearly sz f/u. Pt has had 2 small sz since last OV. One on Christmas eve and 2nd on Jan 27th. Reports no missed doses. Doing well overall.      HISTORY OF PRESENT ILLNESS: 10/30/21  ALL: Mr Robert Byrd returns for follow up for seizures. He continues lacosamide 200mg  and carbamazepine 600mg  BID. He denies missed doses of medications. He presents with his wife, today, who states that he had two small seizures since 09/07/2021. He reports that he felt a feeling of cold chills that came over his body. His wife reports that he did not recognize her. He was not able to respond appropriately. Wife noticed unusual movement of his tongue. He did not know his kid's names. Another event occurred during the night of 10/11/2021. His wife woke up to him "singing" or talking jibberish". She felt that he was confused but not sure if he was asleep. He rolled over and went to sleep and woke up fine. He did not remember then event over night. Last dose was this morning. Wife mentions at the end of the visit that he reduced dose of lacosamide to 150mg  BID in November after his son accidentally threw away his new prescription. He took 150mg  BID dosing for several weeks prior to resuming 200mg  BID dose.    10/29/2020 ALL:  Robert Byrd returns today for seizure follow up. He continues lacosamide 200mg  and carbamazepine 600mg  BID. He is tolerating medicaitons well with no seizure activity. He does report more auras over the past 1-2 years. He has experienced symptoms of feeling cold all over and feeling anxious prior to seizures in the past. These symptoms usually get better when dose changes are made but return at some point, to varying degrees. No seizure activity has followed aura. Dose change last in 2020 with increased dose of lacosamide due to  subtherapeutic levels. Last seizure 10/2018. He is sleeping well. No obvious changes in stress levels. His wife delivered their second son about 6 months ago. He denies missed doses of AEDs.    11/02/2019 ALL:  Robert Byrd is a 31 y.o. male here today for follow up. He is doing well. No seizure activity. He continues to tolerate Vimpat 200mg  BID and Tegretol XR 600mg  BID. No obvious adverse effects. He is expecting his second child as his wife is [redacted] weeks pregnant. He is feeling well today and without concerns.    HISTORY: (copied from my note on 04/28/2019)  Robert Byrd is a 31 y.o. male here today for follow up for seizures. He reported breakthrough seizure thought to be related to increased stress at last visit. Labs show subtherapeutic Vimpat level and dose was increased from 150mg  BID to 200mg  BID. He also takes Tegretol XR 600mg  BID.  He reports that he is tolerating medications well with no obvious adverse effects.  He has had no seizure activity since last being seen.  He is feeling well today and without complaints.   HISTORY: (copied from Westphaliaarolyn Martin's note on 10/28/2018)   UPDATE 2/13/2020CM Robert Byrd , 31 year old male returns for follow-up with history of seizure disorder he reports about 3 brief seizures since last seen in April of last year.  His dosage was changed in April and said he says it worked well for about 6 to 7 months these  have occurred in the last couple of months.  One was due to a stressful situation where he had to speak in front of a group.  He is currently on Tegretol-XR 200 mg 3 tablets twice daily.  He is also on Vimpat 150 twice daily.  Denies missing any doses of his medication.  He returns for reevaluation.   UPDATE 4/11/19CM Robert Byrd, 31 year old male returns for follow-up with history of seizure disorder.  He reports that he has had 4-5 seizures in the last year.  He has not kept a diary, one recently when he was in the car with his wife when  she was driving  he has missed some doses of his medication.  He also reports that he was on narcotics for back pain several months ago.  He has had therapeutic levels of his medication in the past.  He is currently taking Vimpat 150 twice daily and Tegretol 200 XR 3 in the morning and 2 at night.  He returns for reevaluation.  He knows he cannot drive for 6 months   UPDATE 04/09/2018CM Robert Byrd 31 year old male returns for follow-up of his wife. He has a history of seizure disorder starting at the age 29. He is currently on Tegretol and Vimpat without side effects. He denies missing any doses of his medication. In the past he has had seizures when missing doses of his medication. He returns for reevaluation. 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   UPDATE 10/09/2017CM Robert Byrd, 31 year old male returns for follow-up he has a history of seizure disorder. He is currently on Tegretol 200 mg 2 in the morning and 3 at night along with Vimpat 150 twice daily. He ran out of his Vimpat Saturday morning and had a seizure Sunday  Afternoon while watching football. He has not missed doses of his Tegretol. He returns for reevaluation. He states he wants to have meds increased however the important thing is take the medication as directed and do not run out of medication. He returns for reevaluation   HISTORY AA Seizures started at age 49. Complex partial and GTCs. [facility] neurology as a child. Dr Hyacinth Meeker did an MRI of the brain last when he was [age]. Last EEG when he was 4. Seizures started with staring. He has an aura, starts to feel cold on the inside of his body, he tries to talk a lot, he "passes out". Mother provides most information. He starts salivating, starts moving his tongue around, he can't answer the simplest questions, he starts chewing a lot, staring off. Last GTCS was at age 87, denies missing dosages. He has been on tegretol all his life. Then they added Vimpat. Has had >3 events in the last month, woke up after  urinating in the bed. Girlfriend said he had an episode of talking and saying strange things, afterwards is tired and has to sleep. Not missing doses. Previous to this, he has been controlled ok, maybe one seizure a month. Recently there was an error in his medications, he was on 100mg  vimpat bid and was changed to 50mg  bid and maybe that increased the seizures however was still having them once a month previously.    Also having daily pressure type headaches. Worsening. No weakness. No focal neurologic symptoms. Takes OTC medications daily.    REVIEW OF SYSTEMS: Out of a complete 14 system review of symptoms, the patient complains only of the following symptoms, auras, seizure and all other reviewed systems are negative.  ALLERGIES: No Known Allergies  HOME MEDICATIONS: Outpatient Medications Prior to Visit  Medication Sig Dispense Refill   Fluticasone-Salmeterol (ADVAIR) 250-50 MCG/DOSE AEPB Inhale 1 puff into the lungs 2 (two) times daily.     montelukast (SINGULAIR) 10 MG tablet Take 10 mg by mouth at bedtime.     carbamazepine (TEGRETOL XR) 200 MG 12 hr tablet Take 3 tablets (600 mg total) by mouth 2 (two) times daily. 3 pills twice daily. 540 tablet 1   lacosamide (VIMPAT) 200 MG TABS tablet Take 1 tablet (200 mg total) by mouth 2 (two) times daily. 180 tablet 1   No facility-administered medications prior to visit.    PAST MEDICAL HISTORY: Past Medical History:  Diagnosis Date   Seizure (HCC) 1993    PAST SURGICAL HISTORY: History reviewed. No pertinent surgical history.  FAMILY HISTORY: Family History  Problem Relation Age of Onset   Hypertension Maternal Grandmother    Diabetes Maternal Grandfather     SOCIAL HISTORY: Social History   Socioeconomic History   Marital status: Married    Spouse name: Sue Lush   Number of children: 1   Years of education: Bachelor's   Highest education level: Not on file  Occupational History   Occupation: [employer]  Tobacco  Use   Smoking status: Never   Smokeless tobacco: Never  Substance and Sexual Activity   Alcohol use: No    Alcohol/week: 0.0 standard drinks   Drug use: No   Sexual activity: Not on file  Other Topics Concern   Not on file  Social History Narrative   Live at with home with wife, child.   Right handed.   Caffeine use: Drinks soda occassionally    Social Determinants of Corporate investment banker Strain: Not on file  Food Insecurity: Not on file  Transportation Needs: Not on file  Physical Activity: Not on file  Stress: Not on file  Social Connections: Not on file  Intimate Partner Violence: Not on file      PHYSICAL EXAM  Vitals:   10/30/21 1501  BP: 132/82  Pulse: 75  Weight: 207 lb (93.9 kg)  Height: 6\' 1"  (1.854 m)    Body mass index is 27.31 kg/m.  Generalized: Well developed, in no acute distress  Cardiology: normal rate and rhythm, no murmur noted Neurological examination  Mentation: Alert oriented to time, place, history taking. Follows all commands speech and language fluent Cranial nerve II-XII: Pupils were equal round reactive to light. Extraocular movements were full, visual field were full on confrontational test. Facial sensation and strength were normal. Head turning and shoulder shrug  were normal and symmetric. Motor: The motor testing reveals 5 over 5 strength of all 4 extremities. Good symmetric motor tone is noted throughout.  Sensory: Sensory testing is intact to soft touch on all 4 extremities. No evidence of extinction is noted.  Coordination: Cerebellar testing reveals good finger-nose-finger and heel-to-shin bilaterally.  Gait and station: Gait is normal.   DIAGNOSTIC DATA (LABS, IMAGING, TESTING) - I reviewed patient records, labs, notes, testing and imaging myself where available.  No flowsheet data found.   Lab Results  Component Value Date   WBC 2.9 (L) 10/29/2020   HGB 14.5 10/29/2020   HCT 41.3 10/29/2020   MCV 87 10/29/2020    PLT 264 10/29/2020      Component Value Date/Time   NA 141 10/29/2020 1612   K 4.1 10/29/2020 1612   CL 102 10/29/2020 1612   CO2 23  10/29/2020 1612   GLUCOSE 86 10/29/2020 1612   BUN 13 10/29/2020 1612   CREATININE 1.14 10/29/2020 1612   CALCIUM 9.8 10/29/2020 1612   PROT 8.0 10/29/2020 1612   ALBUMIN 4.9 10/29/2020 1612   AST 20 10/29/2020 1612   ALT 27 10/29/2020 1612   ALKPHOS 86 10/29/2020 1612   BILITOT <0.2 10/29/2020 1612   GFRNONAA 85 10/29/2020 1612   GFRAA 99 10/29/2020 1612   No results found for: CHOL, HDL, LDLCALC, LDLDIRECT, TRIG, CHOLHDL No results found for: FYBO1B Lab Results  Component Value Date   VITAMINB12 397 12/20/2014   No results found for: TSH     ASSESSMENT AND PLAN 31 y.o. year old male  has a past medical history of Seizure (HCC) (1993). here with     ICD-10-CM   1. Partial symptomatic epilepsy with complex partial seizures, not intractable, without status epilepticus (HCC)  G40.209 CBC with Differential/Platelets    CMP    Carbamazepine level, total    Lacosamide    carbamazepine (TEGRETOL XR) 200 MG 12 hr tablet    EEG adult    lacosamide (VIMPAT) 200 MG TABS tablet       He is doing fairly well, however, reports two events of altered awareness consistent with previous seizure events. Most likely in the setting of lowering lacosamide dosing for several weeks. He is tolerating lacosamide and carbamazepine as prescribed. I will update labs today to ensure adequate level of AED. I will order EEG for evaluation. Will discuss changes in regimen, again, following results. Seizure precautions advised. He is aware that he can not drive until seizure free for 6 months. He was advised to continue healthy lifestyle habits. Adequate hydration advised. He will follow up in 3-4 months sooner if needed. He verbalizes understanding and agreement with this plan.    Orders Placed This Encounter  Procedures   CBC with Differential/Platelets   CMP    Carbamazepine level, total   Lacosamide   EEG adult    Standing Status:   Future    Standing Expiration Date:   10/30/2022    Order Specific Question:   Reason for exam    Answer:   Seizure     Meds ordered this encounter  Medications   carbamazepine (TEGRETOL XR) 200 MG 12 hr tablet    Sig: Take 3 tablets (600 mg total) by mouth 2 (two) times daily. 3 pills twice daily.    Dispense:  540 tablet    Refill:  3    Order Specific Question:   Supervising Provider    Answer:   Anson Fret [[id number]]   lacosamide (VIMPAT) 200 MG TABS tablet    Sig: Take 1 tablet (200 mg total) by mouth 2 (two) times daily.    Dispense:  180 tablet    Refill:  1    Order Specific Question:   Supervising Provider    Answer:   Anson Fret [id number]      I spent 30 minutes of face-to-face and non-face-to-face time with patient.  This included previsit chart review, lab review, study review, order entry, electronic health record documentation, patient education.   [name], FNP-C 10/30/2021, 4:03 PM [facility] [address], [address] [place], [place] [address] [phone]

## 2021-10-31 ENCOUNTER — Other Ambulatory Visit: Payer: BC Managed Care – PPO | Admitting: *Deleted

## 2021-11-04 ENCOUNTER — Telehealth: Payer: Self-pay

## 2021-11-04 NOTE — Telephone Encounter (Signed)
-----   Message from [name], NP sent at 11/04/2021  7:38 AM EST ----- Please let him know that his lab work looks good. We will watch for EEG results. Please make sure he has his medicaiton and is taking daily as prescribed. TY!

## 2021-11-04 NOTE — Telephone Encounter (Signed)
I called patient. I discussed this with him. He reports taking tegretol and vimpat daily as prescribed. Pt verbalized understanding of results. Pt had no questions at this time but was encouraged to call back if questions arise.

## 2021-11-06 ENCOUNTER — Ambulatory Visit: Payer: BC Managed Care – PPO | Admitting: Neurology

## 2021-11-06 DIAGNOSIS — G40209 Localization-related (focal) (partial) symptomatic epilepsy and epileptic syndromes with complex partial seizures, not intractable, without status epilepticus: Secondary | ICD-10-CM | POA: Diagnosis not present

## 2021-11-07 LAB — COMPREHENSIVE METABOLIC PANEL
ALT: 27 IU/L (ref 0–44)
AST: 25 IU/L (ref 0–40)
Albumin/Globulin Ratio: 1.6 (ref 1.2–2.2)
Albumin: 4.9 g/dL (ref 4.0–5.0)
Alkaline Phosphatase: 103 IU/L (ref 44–121)
BUN/Creatinine Ratio: 11 (ref 9–20)
BUN: 12 mg/dL (ref 6–20)
Bilirubin Total: <0.2 mg/dL (ref 0.0–1.2)
CO2: 25 mmol/L (ref 20–29)
Calcium: 10 mg/dL (ref 8.7–10.2)
Chloride: 100 mmol/L (ref 96–106)
Creatinine, Ser: 1.14 mg/dL (ref 0.76–1.27)
Globulin, Total: 3 g/dL (ref 1.5–4.5)
Glucose: 91 mg/dL (ref 70–99)
Potassium: 4.6 mmol/L (ref 3.5–5.2)
Sodium: 141 mmol/L (ref 134–144)
Total Protein: 7.9 g/dL (ref 6.0–8.5)
eGFR: 88 mL/min/{1.73_m2} (ref >59)

## 2021-11-07 LAB — CBC WITH DIFFERENTIAL/PLATELET
Basophils Absolute: 0 10*3/uL (ref 0.0–0.2)
Basos: 1 %
EOS (ABSOLUTE): 0.1 10*3/uL (ref 0.0–0.4)
Eos: 3 %
Hematocrit: 42.9 % (ref 37.5–51.0)
Hemoglobin: 14.8 g/dL (ref 13.0–17.7)
Immature Grans (Abs): 0 10*3/uL (ref 0.0–0.1)
Immature Granulocytes: 0 %
Lymphocytes Absolute: 1.7 10*3/uL (ref 0.7–3.1)
Lymphs: 54 %
MCH: 30 pg (ref 26.6–33.0)
MCHC: 34.5 g/dL (ref 31.5–35.7)
MCV: 87 fL (ref 79–97)
Monocytes Absolute: 0.2 10*3/uL (ref 0.1–0.9)
Monocytes: 8 %
Neutrophils Absolute: 1 10*3/uL — ABNORMAL LOW (ref 1.4–7.0)
Neutrophils: 34 %
Platelets: 267 10*3/uL (ref 150–450)
RBC: 4.94 x10E6/uL (ref 4.14–5.80)
RDW: 13 % (ref 11.6–15.4)
WBC: 3.1 10*3/uL — ABNORMAL LOW (ref 3.4–10.8)

## 2021-11-07 LAB — LACOSAMIDE: Lacosamide: 4.8 ug/mL — ABNORMAL LOW (ref 5.0–10.0)

## 2021-11-07 LAB — CARBAMAZEPINE LEVEL, TOTAL: Carbamazepine (Tegretol), S: 7.1 ug/mL (ref 4.0–12.0)

## 2021-11-08 NOTE — Procedures (Signed)
° ° °  History:  31 year old man with epilepsy   EEG classification:  Awake and asleep  Description of the recording: The background rhythms of this recording consists of a fairly well modulated medium amplitude background activity of 8-9 Hz. As the record progresses, the patient initially is in the waking state, but appears to enter the early stage II sleep during the recording, with rudimentary sleep spindles and vertex sharp wave activity seen. During the wakeful state, photic stimulation is performed, and no abnormal responses were seen. Hyperventilation was also performed, no abnormal response seen. No epileptiform discharges seen during this recording. There was no focal slowing. EKG monitor shows no evidence of cardiac rhythm abnormalities with a heart rate of 78.  Impression: This is a normal EEG recording in the waking and sleeping state. No evidence interictal epileptiform discharges were seen at any time during the recording.  A normal EEG does not exclude a diagnosis of epilepsy.    Windell Norfolk, MD [facility]

## 2021-11-12 ENCOUNTER — Telehealth: Payer: Self-pay | Admitting: *Deleted

## 2021-11-12 NOTE — Telephone Encounter (Signed)
-----   Message from [name], NP sent at 11/11/2021  4:00 PM EST ----- Please let Robert Byrd know that his EEG was normal. I am most concerned that breakthrough seizure activity was related to missed doses as discussed. If he is doing well and tolerating medication without breakthrough seizure activity, we can continue current treatment plan. Please remind him not to drive until seizure free for 6 months and let us know if he has any concerns of seizure like activity. TY!

## 2021-11-12 NOTE — Telephone Encounter (Signed)
Called and spoke with pt about results. He verbalized understanding. He has had no further seizures. He will continue meds as prescribed. Expressed importance of not missing any doses. Reminded him he cannot drive until 6 months seizure free. He will call in the future if he has any new seizures/new sx.

## 2022-03-10 ENCOUNTER — Ambulatory Visit: Payer: BC Managed Care – PPO | Admitting: Family Medicine

## 2022-04-02 NOTE — Patient Instructions (Addendum)
Below is our plan:  We will continue carbamazepine and lacosamide twice daily. Try to be consistent with 12 hour dosing.   Please make sure you are consistent with timing of seizure medication. I recommend annual visit with primary care provider (PCP) for complete physical and routine blood work. I recommend daily intake of vitamin D (400-800iu) and calcium (800-1000mg ) for bone health. Discuss Dexa screening with PCP.   According to [place] law, you can not drive unless you are seizure / syncope free for at least 6 months and under physician's care.  Please maintain precautions. Do not participate in activities where a loss of awareness could harm you or someone else. No swimming alone, no tub bathing, no hot tubs, no driving, no operating motorized vehicles (cars, ATVs, motocycles, etc), lawnmowers, power tools or firearms. No standing at heights, such as rooftops, ladders or stairs. Avoid hot objects such as stoves, heaters, open fires. Wear a helmet when riding a bicycle, scooter, skateboard, etc. and avoid areas of traffic. Set your water heater to 120 degrees or less.   Please make sure you are staying well hydrated. I recommend 50-60 ounces daily. Well balanced diet and regular exercise encouraged. Consistent sleep schedule with 6-8 hours recommended.   Please continue follow up with care team as directed.   Follow up with me in 1 year   You may receive a survey regarding today's visit. I encourage you to leave honest feed back as I do use this information to improve patient care. Thank you for seeing me today!

## 2022-04-02 NOTE — Progress Notes (Signed)
PATIENT: Robert Byrd DOB: [birth year]  REASON FOR VISIT: follow up HISTORY FROM: patient  Chief Complaint  Patient presents with   Follow-up    Rm 1, alone. Here for sz f/u. Pt reports no sz like activity since last ov. Pt would like to discuss work accomodation.    HISTORY OF PRESENT ILLNESS:  04/03/22  ALL: [name] returns for follow up for seizures. He was last seen 10/2021 and reported two events that were concerning for seizure activity, last being 10/11/2021, in the setting of missed AED. EEG was normal. Carbamzepine level normal, lacosamide level was a little low. He has continued carbamazepine 600mg  BID and lacosamide 200mg  BID. Usually takes at 7am and around 10-11pm. He is tolerating well. No seizure activity. He has been working from home.   10/30/2021 ALL: Robert Levi returns for follow up for seizures. He continues lacosamide 200mg  and carbamazepine 600mg  BID. He denies missed doses of medications. He presents with his wife, today, who states that he had two small seizures since 09/07/2021. He reports that he felt a feeling of cold chills that came over his body. His wife reports that he did not recognize her. He was not able to respond appropriately. Wife noticed unusual movement of his tongue. He did not know his kid's names. Another event occurred during the night of 10/11/2021. His wife woke up to him "singing" or talking jibberish". She felt that he was confused but not sure if he was asleep. He rolled over and went to sleep and woke up fine. He did not remember then event over night. Last dose was this morning. Wife mentions at the end of the visit that he reduced dose of lacosamide to 150mg  BID in November after his son accidentally threw away his new prescription. He took 150mg  BID dosing for several weeks prior to resuming 200mg  BID dose.    10/29/2020 ALL:  [name] returns today for seizure follow up. He continues lacosamide 200mg  and carbamazepine 600mg  BID. He is  tolerating medicaitons well with no seizure activity. He does report more auras over the past 1-2 years. He has experienced symptoms of feeling cold all over and feeling anxious prior to seizures in the past. These symptoms usually get better when dose changes are made but return at some point, to varying degrees. No seizure activity has followed aura. Dose change last in 2020 with increased dose of lacosamide due to subtherapeutic levels. Last seizure 10/2018. He is sleeping well. No obvious changes in stress levels. His wife delivered their second son about 6 months ago. He denies missed doses of AEDs.    11/02/2019 ALL:  [name] is a 31 y.o. male here today for follow up. He is doing well. No seizure activity. He continues to tolerate Vimpat 200mg  BID and Tegretol XR 600mg  BID. No obvious adverse effects. He is expecting his second child as his wife is [redacted] weeks pregnant. He is feeling well today and without concerns.    HISTORY: (copied from my note on 04/28/2019)  Robert Byrd is a 31 y.o. male here today for follow up for seizures. He reported breakthrough seizure thought to be related to increased stress at last visit. Labs show subtherapeutic Vimpat level and dose was increased from 150mg  BID to 200mg  BID. He also takes Tegretol XR 600mg  BID.  He reports that he is tolerating medications well with no obvious adverse effects.  He has had no seizure activity since last being seen.  He is feeling well today  and without complaints.   HISTORY: (copied from Robert Byrd's note on 10/28/2018)   UPDATE 2/13/2020CM Robert. Byrd , 31 year old male returns for follow-up with history of seizure disorder he reports about 3 brief seizures since last seen in April of last year.  His dosage was changed in April and said he says it worked well for about 6 to 7 months these have occurred in the last couple of months.  One was due to a stressful situation where he had to speak in front of a group.  He is currently  on Tegretol-XR 200 mg 3 tablets twice daily.  He is also on Vimpat 150 twice daily.  Denies missing any doses of his medication.  He returns for reevaluation.   UPDATE 4/11/19CM Robert. Byrd, 32 year old male returns for follow-up with history of seizure disorder.  He reports that he has had 4-5 seizures in the last year.  He has not kept a diary, one recently when he was in the car with his wife when  she was driving he has missed some doses of his medication.  He also reports that he was on narcotics for back pain several months ago.  He has had therapeutic levels of his medication in the past.  He is currently taking Vimpat 150 twice daily and Tegretol 200 XR 3 in the morning and 2 at night.  He returns for reevaluation.  He knows he cannot drive for 6 months   UPDATE 04/09/2018CM Robert. Byrd 31 year old male returns for follow-up of his wife. He has a history of seizure disorder starting at the age 47. He is currently on Tegretol and Vimpat without side effects. He denies missing any doses of his medication. In the past he has had seizures when missing doses of his medication. He returns for reevaluation. 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   UPDATE 10/09/2017CM Robert. Byrd, 31 year old male returns for follow-up he has a history of seizure disorder. He is currently on Tegretol 200 mg 2 in the morning and 3 at night along with Vimpat 150 twice daily. He ran out of his Vimpat Saturday morning and had a seizure Sunday  Afternoon while watching football. He has not missed doses of his Tegretol. He returns for reevaluation. He states he wants to have meds increased however the important thing is take the medication as directed and do not run out of medication. He returns for reevaluation   HISTORY AA Seizures started at age 26. Complex partial and GTCs. [facility] as a child. Dr Hyacinth Meeker did an MRI of the brain last when he was [age]. Last EEG when he was 4. Seizures started with staring. He has an  aura, starts to feel cold on the inside of his body, he tries to talk a lot, he "passes out". Mother provides most information. He starts salivating, starts moving his tongue around, he can't answer the simplest questions, he starts chewing a lot, staring off. Last GTCS was at age 52, denies missing dosages. He has been on tegretol all his life. Then they added Vimpat. Has had >3 events in the last month, woke up after urinating in the bed. Girlfriend said he had an episode of talking and saying strange things, afterwards is tired and has to sleep. Not missing doses. Previous to this, he has been controlled ok, maybe one seizure a month. Recently there was an error in his medications, he was on 100mg  vimpat bid and was changed to 50mg  bid and maybe that  increased the seizures however was still having them once a month previously.    Also having daily pressure type headaches. Worsening. No weakness. No focal neurologic symptoms. Takes OTC medications daily.    REVIEW OF SYSTEMS: Out of a complete 14 system review of symptoms, the patient complains only of the following symptoms, auras, seizure and all other reviewed systems are negative.  ALLERGIES: No Known Allergies  HOME MEDICATIONS: Outpatient Medications Prior to Visit  Medication Sig Dispense Refill   carbamazepine (TEGRETOL XR) 200 MG 12 hr tablet Take 3 tablets (600 mg total) by mouth 2 (two) times daily. 3 pills twice daily. 540 tablet 3   Fluticasone-Salmeterol (ADVAIR) 250-50 MCG/DOSE AEPB Inhale 1 puff into the lungs 2 (two) times daily.     lacosamide (VIMPAT) 200 MG TABS tablet Take 1 tablet (200 mg total) by mouth 2 (two) times daily. 180 tablet 1   montelukast (SINGULAIR) 10 MG tablet Take 10 mg by mouth at bedtime.     No facility-administered medications prior to visit.    PAST MEDICAL HISTORY: Past Medical History:  Diagnosis Date   Seizure (HCC) 1993    PAST SURGICAL HISTORY: History reviewed. No pertinent surgical  history.  FAMILY HISTORY: Family History  Problem Relation Age of Onset   Hypertension Maternal Grandmother    Diabetes Maternal Grandfather     SOCIAL HISTORY: Social History   Socioeconomic History   Marital status: Married    Spouse name: Sue Lush   Number of children: 1   Years of education: Bachelor's   Highest education level: Not on file  Occupational History   Occupation: [employer]  Tobacco Use   Smoking status: Never   Smokeless tobacco: Never  Substance and Sexual Activity   Alcohol use: No    Alcohol/week: 0.0 standard drinks of alcohol   Drug use: No   Sexual activity: Not on file  Other Topics Concern   Not on file  Social History Narrative   Live at with home with wife, child.   Right handed.   Caffeine use: Drinks soda occassionally    Social Determinants of Corporate investment banker Strain: Not on file  Food Insecurity: Not on file  Transportation Needs: Not on file  Physical Activity: Not on file  Stress: Not on file  Social Connections: Not on file  Intimate Partner Violence: Not on file      PHYSICAL EXAM  Vitals:   04/03/22 0804  BP: (!) 131/93  Pulse: 79  Weight: 208 lb (94.3 kg)  Height: 6\' 1"  (1.854 m)     Body mass index is 27.44 kg/m.  Generalized: Well developed, in no acute distress  Cardiology: normal rate and rhythm, no murmur noted Neurological examination  Mentation: Alert oriented to time, place, history taking. Follows all commands speech and language fluent Cranial nerve II-XII: Pupils were equal round reactive to light. Extraocular movements were full, visual field were full on confrontational test. Facial sensation and strength were normal. Head turning and shoulder shrug  were normal and symmetric. Motor: The motor testing reveals 5 over 5 strength of all 4 extremities. Good symmetric motor tone is noted throughout.  Sensory: Sensory testing is intact to soft touch on all 4 extremities. No evidence of  extinction is noted.  Coordination: Cerebellar testing reveals good finger-nose-finger and heel-to-shin bilaterally.  Gait and station: Gait is normal.   DIAGNOSTIC DATA (LABS, IMAGING, TESTING) - I reviewed patient records, labs, notes, testing and imaging myself where available.  No data to display           Lab Results  Component Value Date   WBC 3.1 (L) 10/30/2021   HGB 14.8 10/30/2021   HCT 42.9 10/30/2021   MCV 87 10/30/2021   PLT 267 10/30/2021      Component Value Date/Time   NA 141 10/30/2021 1536   K 4.6 10/30/2021 1536   CL 100 10/30/2021 1536   CO2 25 10/30/2021 1536   GLUCOSE 91 10/30/2021 1536   BUN 12 10/30/2021 1536   CREATININE 1.14 10/30/2021 1536   CALCIUM 10.0 10/30/2021 1536   PROT 7.9 10/30/2021 1536   ALBUMIN 4.9 10/30/2021 1536   AST 25 10/30/2021 1536   ALT 27 10/30/2021 1536   ALKPHOS 103 10/30/2021 1536   BILITOT <0.2 10/30/2021 1536   GFRNONAA 85 10/29/2020 1612   GFRAA 99 10/29/2020 1612   No results found for: "CHOL", "HDL", "LDLCALC", "LDLDIRECT", "TRIG", "CHOLHDL" No results found for: "HGBA1C" Lab Results  Component Value Date   VITAMINB12 397 12/20/2014   No results found for: "TSH"     ASSESSMENT AND PLAN 31 y.o. year old male  has a past medical history of Seizure (HCC) (1993). here with     ICD-10-CM   1. Partial symptomatic epilepsy with complex partial seizures, not intractable, without status epilepticus (HCC)  G40.209 Carbamazepine level, total    Lacosamide    CBC with Differential/Platelets    CMP      He is doing well. No recent seizure activity. Last event 10/11/2021. He is tolerating lacosamide and carbamazepine as prescribed. I will update labs today to ensure adequate level of AED. Seizure precautions advised. He is aware that he can not drive until seizure free for 6 months. He was advised to continue healthy lifestyle habits. Adequate hydration advised. He will follow up in 1 year, sooner if needed. He  verbalizes understanding and agreement with this plan.    Orders Placed This Encounter  Procedures   Carbamazepine level, total   Lacosamide   CBC with Differential/Platelets   CMP     No orders of the defined types were placed in this encounter.      [name], FNP-C 04/03/2022, 8:57 AM [facility] [address] [place], [place] [address] [phone]

## 2022-04-03 ENCOUNTER — Ambulatory Visit: Payer: BC Managed Care – PPO | Admitting: Family Medicine

## 2022-04-03 ENCOUNTER — Encounter: Payer: Self-pay | Admitting: Family Medicine

## 2022-04-03 VITALS — BP 131/93 | HR 79 | Ht 73.0 in | Wt 208.0 lb

## 2022-04-03 DIAGNOSIS — G40209 Localization-related (focal) (partial) symptomatic epilepsy and epileptic syndromes with complex partial seizures, not intractable, without status epilepticus: Secondary | ICD-10-CM

## 2022-04-03 MED ORDER — LACOSAMIDE 200 MG PO TABS: 200.0000 mg | ORAL_TABLET | Freq: Two times a day (BID) | ORAL | 1 refills | Status: DC

## 2022-04-05 LAB — COMPREHENSIVE METABOLIC PANEL
ALT: 28 IU/L (ref 0–44)
AST: 17 IU/L (ref 0–40)
Albumin/Globulin Ratio: 1.5 (ref 1.2–2.2)
Albumin: 4.6 g/dL (ref 4.1–5.1)
Alkaline Phosphatase: 92 IU/L (ref 44–121)
BUN/Creatinine Ratio: 12 (ref 9–20)
BUN: 13 mg/dL (ref 6–20)
Bilirubin Total: <0.2 mg/dL (ref 0.0–1.2)
CO2: 26 mmol/L (ref 20–29)
Calcium: 9.8 mg/dL (ref 8.7–10.2)
Chloride: 99 mmol/L (ref 96–106)
Creatinine, Ser: 1.12 mg/dL (ref 0.76–1.27)
Globulin, Total: 3.1 g/dL (ref 1.5–4.5)
Glucose: 93 mg/dL (ref 70–99)
Potassium: 4.4 mmol/L (ref 3.5–5.2)
Sodium: 139 mmol/L (ref 134–144)
Total Protein: 7.7 g/dL (ref 6.0–8.5)
eGFR: 90 mL/min/{1.73_m2} (ref >59)

## 2022-04-05 LAB — CBC WITH DIFFERENTIAL/PLATELET
Basophils Absolute: 0 10*3/uL (ref 0.0–0.2)
Basos: 0 %
EOS (ABSOLUTE): 0.1 10*3/uL (ref 0.0–0.4)
Eos: 3 %
Hematocrit: 42.7 % (ref 37.5–51.0)
Hemoglobin: 14.8 g/dL (ref 13.0–17.7)
Immature Grans (Abs): 0 10*3/uL (ref 0.0–0.1)
Immature Granulocytes: 0 %
Lymphocytes Absolute: 1.7 10*3/uL (ref 0.7–3.1)
Lymphs: 59 %
MCH: 30.3 pg (ref 26.6–33.0)
MCHC: 34.7 g/dL (ref 31.5–35.7)
MCV: 88 fL (ref 79–97)
Monocytes Absolute: 0.2 10*3/uL (ref 0.1–0.9)
Monocytes: 8 %
Neutrophils Absolute: 0.9 10*3/uL — ABNORMAL LOW (ref 1.4–7.0)
Neutrophils: 30 %
Platelets: 217 10*3/uL (ref 150–450)
RBC: 4.88 x10E6/uL (ref 4.14–5.80)
RDW: 13 % (ref 11.6–15.4)
WBC: 2.9 10*3/uL — ABNORMAL LOW (ref 3.4–10.8)

## 2022-04-05 LAB — LACOSAMIDE: Lacosamide: 9.3 ug/mL (ref 5.0–10.0)

## 2022-04-05 LAB — CARBAMAZEPINE LEVEL, TOTAL: Carbamazepine (Tegretol), S: 11 ug/mL (ref 4.0–12.0)

## 2022-04-07 ENCOUNTER — Telehealth: Payer: Self-pay | Admitting: Neurology

## 2022-04-07 NOTE — Telephone Encounter (Signed)
Called the patient back and advised of the lab results. Pt verbalized understanding. Pt states that at the visit,Amy had mentioned she would recommend that that he work from home. Although he no longer has driving restrictions he said that she would indicate recommendation of working from home. Advised I would have to bring this to Amy and get her thoughts on this.

## 2022-04-07 NOTE — Telephone Encounter (Signed)
-----   Message from [name], NP sent at 04/07/2022  8:11 AM EDT ----- Please let him know that his labs look good! His WBC is still a little low but stable from previous readings. Continue regular follow up with PCP.

## 2022-04-07 NOTE — Telephone Encounter (Signed)
Attempted to contact the pt to review with him about the working from home question. There was no answer and unable to lvm. If pt calls back please advise  Amy states that him working from home would have to be addressed between him and his employer. From neurology stand point she does not feel that he "has" to work from home but is not opposed to him working from home if allowed by employer. We would not be able to provide any written documentation stating that its recommended as she doesn't have restrictions.   "He was advised that I do not have any hesitations to him working from home if allowed by employer but do not have any restriction that he has to work from home. It will be up to his employer. "

## 2022-04-07 NOTE — Telephone Encounter (Signed)
Pt called back and message was relayed, FYI no call back requested

## 2022-05-05 ENCOUNTER — Telehealth: Payer: Self-pay | Admitting: Family Medicine

## 2022-05-05 NOTE — Telephone Encounter (Signed)
Pt called and stated he had a seizure yesterday and it lasted between 3 sec and minutes. Pt said he just wanted wanted to let her know he had a seizure.

## 2022-05-05 NOTE — Telephone Encounter (Signed)
I have called the patient and asked the patient if he had missed any doses of his medication and he states he has not. He denies any signs of infection or illness. Denies any stressors or lack of sleep. Pt overall doesn't have any changes that seem concerning.I went ahead and scheduled for a follow up visit with amy for tomorrow for them to discuss potentially ordering EMU and making medication changes. Pt verbalized understanding. Pt made aware no driving unless seizure free for 6 months. Pt verbalized understanding.   Amy questioned: "It would depend on the setting of seizure. His last was in 09/2021 after missing dose of AED. Any recent missed doses? Any illness/infections? Increased stress or lack of sleep? If no obvious triggers, we need to start a third agent. I would also like to consider a ambulatory EEG as his routine EEG was normal with no seizure activity noted. We could also consider EMU if seizure continue. No driving until seizure free for 6 months."

## 2022-05-05 NOTE — Progress Notes (Unsigned)
PATIENT: Robert Byrd DOB: 22-Jan-1991  REASON FOR VISIT: follow up HISTORY FROM: patient  No chief complaint on file.  HISTORY OF PRESENT ILLNESS:  05/05/22  ALL: Robert Byrd returns for acute visit for breakthrough seizure occurring   04/03/2022 ALL: Robert Byrd returns for follow up for seizures. He was last seen 10/2021 and reported two events that were concerning for seizure activity, last being 10/11/2021, in the setting of missed AED. EEG was normal. Carbamzepine level normal, lacosamide level was a little low. He has continued carbamazepine 600mg  BID and lacosamide 200mg  BID. Usually takes at 7am and around 10-11pm. He is tolerating well. No seizure activity. He has been working from home.   10/30/2021 ALL: Robert Byrd returns for follow up for seizures. He continues lacosamide 200mg  and carbamazepine 600mg  BID. He denies missed doses of medications. He presents with his wife, today, who states that he had two small seizures since 09/07/2021. He reports that he felt a feeling of cold chills that came over his body. His wife reports that he did not recognize her. He was not able to respond appropriately. Wife noticed unusual movement of his tongue. He did not know his kid's names. Another event occurred during the night of 10/11/2021. His wife woke up to him "singing" or talking jibberish". She felt that he was confused but not sure if he was asleep. He rolled over and went to sleep and woke up fine. He did not remember then event over night. Last dose was this morning. Wife mentions at the end of the visit that he reduced dose of lacosamide to 150mg  BID in November after his son accidentally threw away his new prescription. He took 150mg  BID dosing for several weeks prior to resuming 200mg  BID dose.    10/29/2020 ALL:  Robert Byrd returns today for seizure follow up. He continues lacosamide 200mg  and carbamazepine 600mg  BID. He is tolerating medicaitons well with no seizure activity. He does report  more auras over the past 1-2 years. He has experienced symptoms of feeling cold all over and feeling anxious prior to seizures in the past. These symptoms usually get better when dose changes are made but return at some point, to varying degrees. No seizure activity has followed aura. Dose change last in 2020 with increased dose of lacosamide due to subtherapeutic levels. Last seizure 10/2018. He is sleeping well. No obvious changes in stress levels. His wife delivered their second son about 6 months ago. He denies missed doses of AEDs.    11/02/2019 ALL:  Robert Byrd is a 31 y.o. male here today for follow up. He is doing well. No seizure activity. He continues to tolerate Vimpat 200mg  BID and Tegretol XR 600mg  BID. No obvious adverse effects. He is expecting his second child as his wife is [redacted] weeks pregnant. He is feeling well today and without concerns.    HISTORY: (copied from my note on 04/28/2019)  Robert Byrd is a 31 y.o. male here today for follow up for seizures. He reported breakthrough seizure thought to be related to increased stress at last visit. Labs show subtherapeutic Vimpat level and dose was increased from 150mg  BID to 200mg  BID. He also takes Tegretol XR 600mg  BID.  He reports that he is tolerating medications well with no obvious adverse effects.  He has had no seizure activity since last being seen.  He is feeling well today and without complaints.   HISTORY: (copied from Athena Martin's note on 10/28/2018)   UPDATE 2/13/2020CM Robert Byrd ,  31 year old male returns for follow-up with history of seizure disorder he reports about 3 brief seizures since last seen in April of last year.  His dosage was changed in April and said he says it worked well for about 6 to 7 months these have occurred in the last couple of months.  One was due to a stressful situation where he had to speak in front of a group.  He is currently on Tegretol-XR 200 mg 3 tablets twice daily.  He is also on Vimpat  150 twice daily.  Denies missing any doses of his medication.  He returns for reevaluation.   UPDATE 4/11/19CM Robert Byrd, 31 year old male returns for follow-up with history of seizure disorder.  He reports that he has had 4-5 seizures in the last year.  He has not kept a diary, one recently when he was in the car with his wife when  she was driving he has missed some doses of his medication.  He also reports that he was on narcotics for back pain several months ago.  He has had therapeutic levels of his medication in the past.  He is currently taking Vimpat 150 twice daily and Tegretol 200 XR 3 in the morning and 2 at night.  He returns for reevaluation.  He knows he cannot drive for 6 months   UPDATE 04/09/2018CM Robert Byrd 31 year old male returns for follow-up of his wife. He has a history of seizure disorder starting at the age 59. He is currently on Tegretol and Vimpat without side effects. He denies missing any doses of his medication. In the past he has had seizures when missing doses of his medication. He returns for reevaluation. 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   UPDATE 10/09/2017CM Robert Byrd, 31 year old male returns for follow-up he has a history of seizure disorder. He is currently on Tegretol 200 mg 2 in the morning and 3 at night along with Vimpat 150 twice daily. He ran out of his Vimpat Saturday morning and had a seizure Byrd  Afternoon while watching football. He has not missed doses of his Tegretol. He returns for reevaluation. He states he wants to have meds increased however the important thing is take the medication as directed and do not run out of medication. He returns for reevaluation   HISTORY AA Seizures started at age 30. Complex partial and GTCs. [facility] neurology as a child. Dr Hyacinth Meeker did an MRI of the brain last when he was [age]. Last EEG when he was 4. Seizures started with staring. He has an aura, starts to feel cold on the inside of his body, he tries to talk  a lot, he "passes out". Mother provides most information. He starts salivating, starts moving his tongue around, he can't answer the simplest questions, he starts chewing a lot, staring off. Last GTCS was at age 27, denies missing dosages. He has been on tegretol all his life. Then they added Vimpat. Has had >3 events in the last month, woke up after urinating in the bed. Girlfriend said he had an episode of talking and saying strange things, afterwards is tired and has to sleep. Not missing doses. Previous to this, he has been controlled ok, maybe one seizure a month. Recently there was an error in his medications, he was on 100mg  vimpat bid and was changed to 50mg  bid and maybe that increased the seizures however was still having them once a month previously.    Also having daily pressure type  headaches. Worsening. No weakness. No focal neurologic symptoms. Takes OTC medications daily.    REVIEW OF SYSTEMS: Out of a complete 14 system review of symptoms, the patient complains only of the following symptoms, auras, seizure and all other reviewed systems are negative.  ALLERGIES: No Known Allergies  HOME MEDICATIONS: Outpatient Medications Prior to Visit  Medication Sig Dispense Refill   carbamazepine (TEGRETOL XR) 200 MG 12 hr tablet Take 3 tablets (600 mg total) by mouth 2 (two) times daily. 3 pills twice daily. 540 tablet 3   Fluticasone-Salmeterol (ADVAIR) 250-50 MCG/DOSE AEPB Inhale 1 puff into the lungs 2 (two) times daily.     lacosamide (VIMPAT) 200 MG TABS tablet Take 1 tablet (200 mg total) by mouth 2 (two) times daily. 180 tablet 1   montelukast (SINGULAIR) 10 MG tablet Take 10 mg by mouth at bedtime.     No facility-administered medications prior to visit.    PAST MEDICAL HISTORY: Past Medical History:  Diagnosis Date   Seizure (HCC) 1993    PAST SURGICAL HISTORY: No past surgical history on file.  FAMILY HISTORY: Family History  Problem Relation Age of Onset    Hypertension Maternal Grandmother    Diabetes Maternal Grandfather     SOCIAL HISTORY: Social History   Socioeconomic History   Marital status: Married    Spouse name: Sue Lush   Number of children: 1   Years of education: Bachelor's   Highest education level: Not on file  Occupational History   Occupation: [employer]  Tobacco Use   Smoking status: Never   Smokeless tobacco: Never  Substance and Sexual Activity   Alcohol use: No    Alcohol/week: 0.0 standard drinks of alcohol   Drug use: No   Sexual activity: Not on file  Other Topics Concern   Not on file  Social History Narrative   Live at with home with wife, child.   Right handed.   Caffeine use: Drinks soda occassionally    Social Determinants of Corporate investment banker Strain: Not on file  Food Insecurity: Not on file  Transportation Needs: Not on file  Physical Activity: Not on file  Stress: Not on file  Social Connections: Not on file  Intimate Partner Violence: Not on file      PHYSICAL EXAM  There were no vitals filed for this visit.    There is no height or weight on file to calculate BMI.  Generalized: Well developed, in no acute distress  Cardiology: normal rate and rhythm, no murmur noted Neurological examination  Mentation: Alert oriented to time, place, history taking. Follows all commands speech and language fluent Cranial nerve II-XII: Pupils were equal round reactive to light. Extraocular movements were full, visual field were full on confrontational test. Facial sensation and strength were normal. Head turning and shoulder shrug  were normal and symmetric. Motor: The motor testing reveals 5 over 5 strength of all 4 extremities. Good symmetric motor tone is noted throughout.  Sensory: Sensory testing is intact to soft touch on all 4 extremities. No evidence of extinction is noted.  Coordination: Cerebellar testing reveals good finger-nose-finger and heel-to-shin bilaterally.  Gait  and station: Gait is normal.   DIAGNOSTIC DATA (LABS, IMAGING, TESTING) - I reviewed patient records, labs, notes, testing and imaging myself where available.      No data to display           Lab Results  Component Value Date   WBC 2.9 (L) 04/03/2022  HGB 14.8 04/03/2022   HCT 42.7 04/03/2022   MCV 88 04/03/2022   PLT 217 04/03/2022      Component Value Date/Time   NA 139 04/03/2022 0822   K 4.4 04/03/2022 0822   CL 99 04/03/2022 0822   CO2 26 04/03/2022 0822   GLUCOSE 93 04/03/2022 0822   BUN 13 04/03/2022 0822   CREATININE 1.12 04/03/2022 0822   CALCIUM 9.8 04/03/2022 0822   PROT 7.7 04/03/2022 0822   ALBUMIN 4.6 04/03/2022 0822   AST 17 04/03/2022 0822   ALT 28 04/03/2022 0822   ALKPHOS 92 04/03/2022 0822   BILITOT <0.2 04/03/2022 0822   GFRNONAA 85 10/29/2020 1612   GFRAA 99 10/29/2020 1612   No results found for: "CHOL", "HDL", "LDLCALC", "LDLDIRECT", "TRIG", "CHOLHDL" No results found for: "HGBA1C" Lab Results  Component Value Date   VITAMINB12 397 12/20/2014   No results found for: "TSH"     ASSESSMENT AND PLAN 31 y.o. year old male  has a past medical history of Seizure (HCC) (1993). here with   No diagnosis found.   He is doing well. No recent seizure activity. Last event 10/11/2021. He is tolerating lacosamide and carbamazepine as prescribed. I will update labs today to ensure adequate level of AED. Seizure precautions advised. He is aware that he can not drive until seizure free for 6 months. He was advised to continue healthy lifestyle habits. Adequate hydration advised. He will follow up in 1 year, sooner if needed. He verbalizes understanding and agreement with this plan.    No orders of the defined types were placed in this encounter.    No orders of the defined types were placed in this encounter.      [name], FNP-C 05/05/2022, 4:28 PM [facility] [address] [place], [place] [address] [phone]

## 2022-05-06 ENCOUNTER — Encounter: Payer: Self-pay | Admitting: Family Medicine

## 2022-05-06 ENCOUNTER — Ambulatory Visit: Payer: BC Managed Care – PPO | Admitting: Family Medicine

## 2022-05-06 VITALS — BP 140/92 | HR 79 | Ht 73.0 in | Wt 205.0 lb

## 2022-05-06 DIAGNOSIS — G40209 Localization-related (focal) (partial) symptomatic epilepsy and epileptic syndromes with complex partial seizures, not intractable, without status epilepticus: Secondary | ICD-10-CM

## 2022-05-06 NOTE — Patient Instructions (Signed)
Below is our plan:  We will continue lacosamide 200mg  BID and carbamazepine 600mg  BID. I am checking your drug levels to make sure you are absorbing dose. We will repeat MRI in setting of continued breakthrough seizures. I am also ordering an extended EEG to be done at home. Pending results and continued breakthrough seizure activity, we may consider admission to the epilepsy monitoring unit at the hospital.   Please make sure you are consistent with timing of seizure medication. I recommend annual visit with primary care provider (PCP) for complete physical and routine blood work. I recommend daily intake of vitamin D (400-800iu) and calcium (800-1000mg ) for bone health. Discuss Dexa screening with PCP.   According to [place] law, you can not drive unless you are seizure / syncope free for at least 6 months and under physician's care. I do not have any restrictions for working but you will need to find a ride to work.   Please maintain precautions. Do not participate in activities where a loss of awareness could harm you or someone else. No swimming alone, no tub bathing, no hot tubs, no driving, no operating motorized vehicles (cars, ATVs, motocycles, etc), lawnmowers, power tools or firearms. No standing at heights, such as rooftops, ladders or stairs. Avoid hot objects such as stoves, heaters, open fires. Wear a helmet when riding a bicycle, scooter, skateboard, etc. and avoid areas of traffic. Set your water heater to 120 degrees or less.  Please make sure you are staying well hydrated. I recommend 50-60 ounces daily. Well balanced diet and regular exercise encouraged. Consistent sleep schedule with 6-8 hours recommended.   Please continue follow up with care team as directed.   Follow up with me in 4 months  You may receive a survey regarding today's visit. I encourage you to leave honest feed back as I do use this information to improve patient care. Thank you for seeing me today!

## 2022-05-07 ENCOUNTER — Encounter: Payer: Self-pay | Admitting: Family Medicine

## 2022-05-07 ENCOUNTER — Telehealth: Payer: Self-pay | Admitting: Neurology

## 2022-05-07 NOTE — Telephone Encounter (Signed)
Completed EEG order for through [facility]. Have submitted information to them via fax and received confirmation that it went through.

## 2022-05-09 ENCOUNTER — Telehealth: Payer: Self-pay | Admitting: Family Medicine

## 2022-05-09 LAB — COMPREHENSIVE METABOLIC PANEL
ALT: 23 IU/L (ref 0–44)
AST: 18 IU/L (ref 0–40)
Albumin/Globulin Ratio: 1.7 (ref 1.2–2.2)
Albumin: 4.8 g/dL (ref 4.1–5.1)
Alkaline Phosphatase: 88 IU/L (ref 44–121)
BUN/Creatinine Ratio: 11 (ref 9–20)
BUN: 12 mg/dL (ref 6–20)
Bilirubin Total: <0.2 mg/dL (ref 0.0–1.2)
CO2: 23 mmol/L (ref 20–29)
Calcium: 9.8 mg/dL (ref 8.7–10.2)
Chloride: 102 mmol/L (ref 96–106)
Creatinine, Ser: 1.06 mg/dL (ref 0.76–1.27)
Globulin, Total: 2.8 g/dL (ref 1.5–4.5)
Glucose: 103 mg/dL — ABNORMAL HIGH (ref 70–99)
Potassium: 4.2 mmol/L (ref 3.5–5.2)
Sodium: 142 mmol/L (ref 134–144)
Total Protein: 7.6 g/dL (ref 6.0–8.5)
eGFR: 95 mL/min/{1.73_m2} (ref >59)

## 2022-05-09 LAB — CBC WITH DIFFERENTIAL/PLATELET
Basophils Absolute: 0 10*3/uL (ref 0.0–0.2)
Basos: 0 %
EOS (ABSOLUTE): 0 10*3/uL (ref 0.0–0.4)
Eos: 1 %
Hematocrit: 40.6 % (ref 37.5–51.0)
Hemoglobin: 14.1 g/dL (ref 13.0–17.7)
Immature Grans (Abs): 0 10*3/uL (ref 0.0–0.1)
Immature Granulocytes: 0 %
Lymphocytes Absolute: 1.6 10*3/uL (ref 0.7–3.1)
Lymphs: 56 %
MCH: 30 pg (ref 26.6–33.0)
MCHC: 34.7 g/dL (ref 31.5–35.7)
MCV: 86 fL (ref 79–97)
Monocytes Absolute: 0.2 10*3/uL (ref 0.1–0.9)
Monocytes: 7 %
Neutrophils Absolute: 1 10*3/uL — ABNORMAL LOW (ref 1.4–7.0)
Neutrophils: 36 %
Platelets: 253 10*3/uL (ref 150–450)
RBC: 4.7 x10E6/uL (ref 4.14–5.80)
RDW: 12.4 % (ref 11.6–15.4)
WBC: 2.8 10*3/uL — ABNORMAL LOW (ref 3.4–10.8)

## 2022-05-09 LAB — LACOSAMIDE: Lacosamide: 5.6 ug/mL (ref 5.0–10.0)

## 2022-05-09 LAB — CARBAMAZEPINE LEVEL, TOTAL: Carbamazepine (Tegretol), S: 9.1 ug/mL (ref 4.0–12.0)

## 2022-05-09 NOTE — Telephone Encounter (Signed)
[employer] NPR via [employer] sent to GI  

## 2022-05-12 ENCOUNTER — Encounter: Payer: Self-pay | Admitting: Family Medicine

## 2022-05-15 DIAGNOSIS — Z0289 Encounter for other administrative examinations: Secondary | ICD-10-CM

## 2022-05-22 ENCOUNTER — Telehealth: Payer: Self-pay | Admitting: *Deleted

## 2022-05-22 NOTE — Telephone Encounter (Signed)
Update from [facility]: "Good afternoon, I just wanted to let you know that Robert Byrd (DOB: Oct 27, 1990) is scheduled for their In-Home Video EEG from 06/03/2022- 06/05/2022, per request. Please let us know if you have any questions or comments."

## 2022-05-22 NOTE — Telephone Encounter (Signed)
[employer] form faxed on 05/22/2022

## 2022-05-24 ENCOUNTER — Other Ambulatory Visit: Payer: 59

## 2022-05-31 ENCOUNTER — Ambulatory Visit
Admission: RE | Admit: 2022-05-31 | Discharge: 2022-05-31 | Disposition: A | Discharge status: Home / Self Care | Payer: BC Managed Care – PPO | Source: Ambulatory Visit | Attending: Family Medicine | Admitting: Family Medicine

## 2022-05-31 DIAGNOSIS — G40209 Localization-related (focal) (partial) symptomatic epilepsy and epileptic syndromes with complex partial seizures, not intractable, without status epilepticus: Secondary | ICD-10-CM

## 2022-05-31 MED ORDER — GADOBENATE DIMEGLUMINE 529 MG/ML IV SOLN
20.0000 mL | Freq: Once | INTRAVENOUS | Status: AC | PRN
  Administered 2022-05-31: 20 mL via INTRAVENOUS

## 2022-06-03 DIAGNOSIS — G40001 Localization-related (focal) (partial) idiopathic epilepsy and epileptic syndromes with seizures of localized onset, not intractable, with status epilepticus: Secondary | ICD-10-CM | POA: Diagnosis not present

## 2022-06-03 NOTE — Progress Notes (Signed)
Yes, I would adjust his meds. Since there is possibility of noncompliance, I will switch his carbamazepine to Clobazam or xcopri (once a day medicine instead ot TID) but if he wants surgery, then we can refer the patient to [facility] to surgical evaluation (Patient has to want it). He might be a good candidate for RNS

## 2022-06-04 DIAGNOSIS — G40001 Localization-related (focal) (partial) idiopathic epilepsy and epileptic syndromes with seizures of localized onset, not intractable, with status epilepticus: Secondary | ICD-10-CM | POA: Diagnosis not present

## 2022-06-05 DIAGNOSIS — G40001 Localization-related (focal) (partial) idiopathic epilepsy and epileptic syndromes with seizures of localized onset, not intractable, with status epilepticus: Secondary | ICD-10-CM | POA: Diagnosis not present

## 2022-06-11 ENCOUNTER — Encounter: Payer: Self-pay | Admitting: Family Medicine

## 2022-06-12 NOTE — Telephone Encounter (Signed)
Update from Amari/[facility]: "The reports are ready to review but Dr. April Manson has not completed them yet"   Dr. April Manson out this week and pending his review once he returns.

## 2022-06-14 ENCOUNTER — Other Ambulatory Visit: Payer: 59

## 2022-06-20 ENCOUNTER — Encounter: Payer: Self-pay | Admitting: Neurology

## 2022-06-20 DIAGNOSIS — G40209 Localization-related (focal) (partial) symptomatic epilepsy and epileptic syndromes with complex partial seizures, not intractable, without status epilepticus: Secondary | ICD-10-CM

## 2022-06-20 NOTE — Procedures (Signed)
Clinical History : This is a 31 y/o M with a history of complex partial seizures who presents with concern for breakthrough seizures and daily pressure type headaches.  INTERMITTENT MONITORING with VIDEO TECHNICAL SUMMARY: This AVEEG was performed using equipment provided by [employer] utilizing Bluetooth ( Trackit ) amplifiers with continuous EEGT attended video collection using encrypted remote transmission via [place] secured cellular tower network with data rates for each AVEEG performed. This is a Biomedical engineer AVEEG, obtained, according to the 10-20 international electrode placement system, reformatted digitally into referential and bipolar montages. Data was acquired with a minimum of 21 bipolar connections and sampled at a minimum rate of 250 cycles per second per channel, maximum rate of 450 cycles per second per channel and two channels for EKG. The entire VEEG study was recorded through cable and or radio telemetry for subsequent analysis. Specified epochs of the AVEEG data were identified at the direction of the subject by the depression of a push button by the patient. Each patients event file included data acquired two minutes prior to the push button activation and continuing until two minutes afterwards. AVEEG files were reviewed on [place], Licensed Software provided by [employer] with a digital high frequency filter set at 70 Hz and a low frequency filter set at 1 Hz with a paper speed of 68mm/s resulting in 10 seconds per digital page. This entire AVEEG was reviewed by the EEG Technologist. Random time samples, random sleep samples, clips, patient initiated push button files with included patient daily diary logs, EEG Technologist pruned data was reviewed and verified for accuracy and validity by the governing reading neurologist in full details. This AEEGV was fully compliant with all requirements for CPT 97500 for setup, patient education, take  down and administered by an EEG technologist. Long-Term EEG with Video was monitored intermittently by a qualified EEG technologist for the entirety of the recording; quality check-ins were performed at a minimum of every two hours, checking and documenting real-time data and video to assure the integrity and quality of the recording (e.g., camera position, electrode integrity and impedance), and identify the need for maintenance. For intermittent monitoring, an EEG Technologist monitored no more than 12 patients concurrently. Diagnostic video was captured at least 80% of the time during the recording.   PATIENT EVENTS: There were no patient events noted or captured during this recording.  TECHNOLOGIST EVENTS: No clear epileptiform activity was detected by the reviewing neurodiagnostic technologist during the recording for further evaluation.  TIME SAMPLES: 10-minutes of every 2 hours recorded are reviewed as random time samples.  SLEEP SAMPLES: 5-minutes of every 24 hour recorded sleep cycle are reviewed as random sleep samples.  AWAKE: At maximal level of alertness, the posterior dominant background activity was continuous, reactive, low voltage rhythm of 8Hz . This was symmetric, well-modulated, and attenuated with eye opening. Diffuse, symmetric, frontocentral beta range activity was present.  SLEEP: N1 Sleep (Stage 1) was observed and characterized by the disappearance of alpha rhythm and the appearance of vertex activity.  N2 Sleep (Stage 2) was observed and characterized by vertex waves, K-complexes, and sleep spindles.  N3 (Stage 3) sleep was observed and characterized by high amplitude Delta activity of 20%.  REM sleep was observed.  EKG: There were no arrhythmias or abnormalities noted during this recording.   Impression: This is a normal video ambulatory EEG. There were no events and no epileptiform discharges seen. Normal study, however does not rule out  epilepsy  Windell Norfolk, MD [facility]

## 2022-08-21 ENCOUNTER — Encounter: Payer: Self-pay | Admitting: Neurology

## 2022-08-21 ENCOUNTER — Other Ambulatory Visit: Payer: Self-pay | Admitting: Family Medicine

## 2022-08-21 MED ORDER — XCOPRI 14 X 12.5 MG & 14 X 25 MG PO TBPK: ORAL_TABLET | ORAL | 0 refills | Status: DC

## 2022-08-27 ENCOUNTER — Other Ambulatory Visit: Payer: Self-pay | Admitting: Family Medicine

## 2022-08-27 NOTE — Telephone Encounter (Signed)
Called [facility] at  [phone]. Spoke w/ tech. Placed me on hold to see if medication came in. Medication will be ready around 3pm.

## 2022-09-01 NOTE — Patient Instructions (Signed)
Below is our plan:  We will continue carbamazepine 600mg  and lacosamide 200mg  twice daily. Continue Xcopri per titration pack.   Please make sure you are consistent with timing of seizure medication. I recommend annual visit with primary care provider (PCP) for complete physical and routine blood work. I recommend daily intake of vitamin D (400-800iu) and calcium (800-1000mg ) for bone health. Discuss Dexa screening with PCP.   According to [place] law, you can not drive unless you are seizure / syncope free for at least 6 months and under physician's care.  Please maintain precautions. Do not participate in activities where a loss of awareness could harm you or someone else. No swimming alone, no tub bathing, no hot tubs, no driving, no operating motorized vehicles (cars, ATVs, motocycles, etc), lawnmowers, power tools or firearms. No standing at heights, such as rooftops, ladders or stairs. Avoid hot objects such as stoves, heaters, open fires. Wear a helmet when riding a bicycle, scooter, skateboard, etc. and avoid areas of traffic. Set your water heater to 120 degrees or less.  Please make sure you are staying well hydrated. I recommend 50-60 ounces daily. Well balanced diet and regular exercise encouraged. Consistent sleep schedule with 6-8 hours recommended.   Please continue follow up with care team as directed.   Follow up with me in 4-6 months   You may receive a survey regarding today's visit. I encourage you to leave honest feed back as I do use this information to improve patient care. Thank you for seeing me today!

## 2022-09-01 NOTE — Progress Notes (Signed)
PATIENT: Robert Byrd DOB: 1991/06/28  REASON FOR VISIT: follow up HISTORY FROM: patient  Chief Complaint  Patient presents with   Follow-up    Pt in room #2 with his two boys and wife. Pt here today to f/u with his epilepsy. Pt state he had a seizures two weeks.   HISTORY OF PRESENT ILLNESS:  09/03/22  ALL: Deion returns for follow up for breakthrough seizures. He has continued carbamazepine 600mg  BID and lacosamide 200mg  BID. He was last seen 04/2022 reporting an event on 05/02/22. 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 Ambulatory EEG was normal. He called to report an event 12/7 where he states he started having cold chills. This lasted for about a minute then he lost consciousness. His wife reports he had right eye deviation with stiffness of upper extremities for 1-2 minutes, R>L. Wife had surgery about a week prior and he was having to get up more at night with his children. He denies missed AED. We added Xcopri. He started titration dosing 7 days ago. He is tolerating fairly well. He has felt a little more sleepy but also reports difficulty getting to sleep for the past two nights. He feels that he slept well last night. No chest pain or palpitations. He is eating normally. He drinks about 36-40 ounces of water daily.   05/06/2022 ALL: Jeorge returns for acute visit for breakthrough seizure occurring 8/18. He was driving home from the store. His wife reports that he started driving fast and felt that he was getting ready to have a seizure. He states he felt his heart start to beat harder. He did not feel chills as he usually does. He was able to get home and park car. His wife states that he spaced out and was unable to answer questions. He kept repeating "I don't know". Event lasted 2-3 minutes. She was able to walk him inside. He seemed completely back to baseline after about 5 minutes. He did not seek medical attention. Denies  missed doses of AED. He denies any known triggers. No lack of sleep. No stressors. No drugs or alcohol. Last dose of AED ws about 6:40am.   04/03/2022 ALL: Oather returns for follow up for seizures. He was last seen 10/2021 and reported two events that were concerning for seizure activity, last being 10/11/2021, in the setting of missed AED. EEG was normal. Carbamzepine level normal, lacosamide level was a little low. He has continued carbamazepine 600mg  BID and lacosamide 200mg  BID. Usually takes at 7am and around 10-11pm. He is tolerating well. No seizure activity. He has been working from home.   10/30/2021 ALL: Mr Soulliere returns for follow up for seizures. He continues lacosamide 200mg  and carbamazepine 600mg  BID. He denies missed doses of medications. He presents with his wife, today, who states that he had two small seizures since 09/07/2021. He reports that he felt a feeling of cold chills that came over his body. His wife reports that he did not recognize her. He was not able to respond appropriately. Wife noticed unusual movement of his tongue. He did not know his kid's names. Another event occurred during the night of 10/11/2021. His wife woke up to him "singing" or talking jibberish". She felt that he was confused but not sure if he was asleep. He rolled over and went to sleep and woke up fine. He did not remember then event over night. Last dose was this morning. Wife mentions at the  end of the visit that he reduced dose of lacosamide to 150mg  BID in November after his son accidentally threw away his new prescription. He took 150mg  BID dosing for several weeks prior to resuming 200mg  BID dose.   10/29/2020 ALL:  Richad returns today for seizure follow up. He continues lacosamide 200mg  and carbamazepine 600mg  BID. He is tolerating medicaitons well with no seizure activity. He does report more auras over the past 1-2 years. He has experienced symptoms of feeling cold all over and feeling anxious prior  to seizures in the past. These symptoms usually get better when dose changes are made but return at some point, to varying degrees. No seizure activity has followed aura. Dose change last in 2020 with increased dose of lacosamide due to subtherapeutic levels. Last seizure 10/2018. He is sleeping well. No obvious changes in stress levels. His wife delivered their second son about 6 months ago. He denies missed doses of AEDs.   11/02/2019 ALL:  Jayron Maqueda is a 31 y.o. male here today for follow up. He is doing well. No seizure activity. He continues to tolerate Vimpat 200mg  BID and Tegretol XR 600mg  BID. No obvious adverse effects. He is expecting his second child as his wife is [redacted] weeks pregnant. He is feeling well today and without concerns.   04/28/2019 ALL: Raciel Caffrey is a 31 y.o. male here today for follow up for seizures. He reported breakthrough seizure thought to be related to increased stress at last visit. Labs show subtherapeutic Vimpat level and dose was increased from 150mg  BID to 200mg  BID. He also takes Tegretol XR 600mg  BID.  He reports that he is tolerating medications well with no obvious adverse effects.  He has had no seizure activity since last being seen.  He is feeling well today and without complaints.   UPDATE 2/13/2020CM Mr. Lofton , 31 year old male returns for follow-up with history of seizure disorder he reports about 3 brief seizures since last seen in April of last year.  His dosage was changed in April and said he says it worked well for about 6 to 7 months these have occurred in the last couple of months.  One was due to a stressful situation where he had to speak in front of a group.  He is currently on Tegretol-XR 200 mg 3 tablets twice daily.  He is also on Vimpat 150 twice daily.  Denies missing any doses of his medication.  He returns for reevaluation.   UPDATE 4/11/19CM Mr. Linck, 31 year old male returns for follow-up with history of seizure disorder.  He reports that  he has had 4-5 seizures in the last year.  He has not kept a diary, one recently when he was in the car with his wife when  she was driving he has missed some doses of his medication.  He also reports that he was on narcotics for back pain several months ago.  He has had therapeutic levels of his medication in the past.  He is currently taking Vimpat 150 twice daily and Tegretol 200 XR 3 in the morning and 2 at night.  He returns for reevaluation.  He knows he cannot drive for 6 months   UPDATE 04/09/2018CM Mr. Nowling 31 year old male returns for follow-up of his wife. He has a history of seizure disorder starting at the age 60. He is currently on Tegretol and Vimpat without side effects. He denies missing any doses of his medication. In the past he has had seizures when missing  doses of his medication. He returns for reevaluation. 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   UPDATE 10/09/2017CM [name], 31 year old male returns for follow-up he has a history of seizure disorder. He is currently on Tegretol 200 mg 2 in the morning and 3 at night along with Vimpat 150 twice daily. He ran out of his Vimpat Saturday morning and had a seizure Sunday  Afternoon while watching football. He has not missed doses of his Tegretol. He returns for reevaluation. He states he wants to have meds increased however the important thing is take the medication as directed and do not run out of medication. He returns for reevaluation   HISTORY AA Seizures started at age 89. Complex partial and [facility] Dr Hyacinth MeekerMiller did an MRI of the brain last when he was [age]. Last EEG when he was 4. Seizures started with staring. He has an aura, starts to feel cold on the inside of his body, he tries to talk a lot, he "passes out". Mother provides most information. He starts salivating, starts moving his tongue around, he can't answer the simplest questions, he starts chewing a lot, staring off. Last GTCS was at age  31, denies missing dosages. He has been on tegretol all his life. Then they added Vimpat. Has had >3 events in the last month, woke up after urinating in the bed. Girlfriend said he had an episode of talking and saying strange things, afterwards is tired and has to sleep. Not missing doses. Previous to this, he has been controlled ok, maybe one seizure a month. Recently there was an error in his medications, he was on 100mg  vimpat bid and was changed to 50mg  bid and maybe that increased the seizures however was still having them once a month previously.    Also having daily pressure type headaches. Worsening. No weakness. No focal neurologic symptoms. Takes OTC medications daily.    REVIEW OF SYSTEMS: Out of a complete 14 system review of symptoms, the patient complains only of the following symptoms, auras, seizure and all other reviewed systems are negative.  ALLERGIES: No Known Allergies  HOME MEDICATIONS: Outpatient Medications Prior to Visit  Medication Sig Dispense Refill   carbamazepine (TEGRETOL XR) 200 MG 12 hr tablet Take 3 tablets (600 mg total) by mouth 2 (two) times daily. 3 pills twice daily. 540 tablet 3   Cenobamate (XCOPRI) 14 x 12.5 MG & 14 x 25 MG TBPK Take 12.5mg  daily for 2 weeks then increase dose to 25mg  daily for two weeks. 1 each 0   fexofenadine (ALLEGRA) 180 MG tablet Take 180 mg by mouth daily.     Fluticasone-Salmeterol (ADVAIR) 250-50 MCG/DOSE AEPB Inhale 1 puff into the lungs 2 (two) times daily.     lacosamide (VIMPAT) 200 MG TABS tablet Take 1 tablet (200 mg total) by mouth 2 (two) times daily. 180 tablet 1   montelukast (SINGULAIR) 10 MG tablet Take 10 mg by mouth at bedtime.     No facility-administered medications prior to visit.    PAST MEDICAL HISTORY: Past Medical History:  Diagnosis Date   Seizure (HCC) 1993    PAST SURGICAL HISTORY: History reviewed. No pertinent surgical history.  FAMILY HISTORY: Family History  Problem Relation Age of Onset    Hypertension Maternal Grandmother    Diabetes Maternal Grandfather     SOCIAL HISTORY: Social History   Socioeconomic History   Marital status: Married    Spouse name: Sue Lushndrea  Number of children: 1   Years of education: Bachelor's   Highest education level: Not on file  Occupational History   Occupation: [employer]  Tobacco Use   Smoking status: Never   Smokeless tobacco: Never  Substance and Sexual Activity   Alcohol use: No    Alcohol/week: 0.0 standard drinks of alcohol   Drug use: No   Sexual activity: Not on file  Other Topics Concern   Not on file  Social History Narrative   Live at with home with wife, child.   Right handed.   Caffeine use: Drinks soda occassionally    Social Determinants of Corporate investment banker Strain: Not on file  Food Insecurity: Not on file  Transportation Needs: Not on file  Physical Activity: Not on file  Stress: Not on file  Social Connections: Not on file  Intimate Partner Violence: Not on file      PHYSICAL EXAM  Vitals:   09/03/22 0813  BP: 132/88  Pulse: 83  Weight: 215 lb (97.5 kg)  Height: 6\' 2"  (1.88 m)     Body mass index is 27.6 kg/m.  Generalized: Well developed, in no acute distress  Cardiology: normal rate and rhythm, no murmur noted Neurological examination  Mentation: Alert oriented to time, place, history taking. Follows all commands speech and language fluent Cranial nerve II-XII: Pupils were equal round reactive to light. Extraocular movements were full, visual field were full on confrontational test. Facial sensation and strength were normal. Head turning and shoulder shrug  were normal and symmetric. Motor: The motor testing reveals 5 over 5 strength of all 4 extremities. Good symmetric motor tone is noted throughout.  Sensory: Sensory testing is intact to soft touch on all 4 extremities. No evidence of extinction is noted.  Coordination: Cerebellar testing reveals good finger-nose-finger  and heel-to-shin bilaterally.  Gait and station: Gait is normal.    DIAGNOSTIC DATA (LABS, IMAGING, TESTING) - I reviewed patient records, labs, notes, testing and imaging myself where available.      No data to display           Lab Results  Component Value Date   WBC 2.8 (L) 05/06/2022   HGB 14.1 05/06/2022   HCT 40.6 05/06/2022   MCV 86 05/06/2022   PLT 253 05/06/2022      Component Value Date/Time   NA 142 05/06/2022 1403   K 4.2 05/06/2022 1403   CL 102 05/06/2022 1403   CO2 23 05/06/2022 1403   GLUCOSE 103 (H) 05/06/2022 1403   BUN 12 05/06/2022 1403   CREATININE 1.06 05/06/2022 1403   CALCIUM 9.8 05/06/2022 1403   PROT 7.6 05/06/2022 1403   ALBUMIN 4.8 05/06/2022 1403   AST 18 05/06/2022 1403   ALT 23 05/06/2022 1403   ALKPHOS 88 05/06/2022 1403   BILITOT <0.2 05/06/2022 1403   GFRNONAA 85 10/29/2020 1612   GFRAA 99 10/29/2020 1612   No results found for: "CHOL", "HDL", "LDLCALC", "LDLDIRECT", "TRIG", "CHOLHDL" No results found for: "HGBA1C" Lab Results  Component Value Date   VITAMINB12 397 12/20/2014   No results found for: "TSH"     ASSESSMENT AND PLAN 31 y.o. year old male  has a past medical history of Seizure (HCC) (1993). here with     ICD-10-CM   1. Partial symptomatic epilepsy with complex partial seizures, not intractable, without status epilepticus (HCC)  G40.209 Carbamazepine level, total    Lacosamide    CBC with Differential/Platelets  CMP      Stryker returns with reports of a breakthrough seizure on 08/21/2022. I will update labs today to ensure adequate level of AED. He will continue Xcopri per titration schedule, lacosamide  and carbamazepine  BID. We have discussed consideration of RNS. He is interested and would like [facility]. We have scheduled an appt for further discussion with Dr Teresa Coombs 09/2022. Seizure precautions advised. He is aware that he can not drive until seizure free for 6 months. He was advised  to continue healthy lifestyle habits. Adequate hydration advised. He will follow up pending appt with Dr Teresa Coombs. He verbalizes understanding and agreement with this plan.    Orders Placed This Encounter  Procedures   Carbamazepine level, total   Lacosamide   CBC with Differential/Platelets   CMP     No orders of the defined types were placed in this encounter.     I spent 30 minutes of face-to-face and non-face-to-face time with patient.  This included previsit chart review, lab review, study review, order entry, electronic health record documentation, patient education.   [name], FNP-C 09/03/2022, 8:56 AM [facility] [address], [address] [place], [place] [address] [phone]

## 2022-09-03 ENCOUNTER — Encounter: Payer: Self-pay | Admitting: Family Medicine

## 2022-09-03 ENCOUNTER — Ambulatory Visit: Payer: BC Managed Care – PPO | Admitting: Family Medicine

## 2022-09-03 VITALS — BP 132/88 | HR 83 | Ht 74.0 in | Wt 215.0 lb

## 2022-09-03 DIAGNOSIS — G40209 Localization-related (focal) (partial) symptomatic epilepsy and epileptic syndromes with complex partial seizures, not intractable, without status epilepticus: Secondary | ICD-10-CM | POA: Diagnosis not present

## 2022-09-03 NOTE — Telephone Encounter (Signed)
You can double book him at 345 the second week of Jan. Thanks

## 2022-09-09 LAB — COMPREHENSIVE METABOLIC PANEL
ALT: 26 IU/L (ref 0–44)
AST: 21 IU/L (ref 0–40)
Albumin/Globulin Ratio: 1.5 (ref 1.2–2.2)
Albumin: 4.8 g/dL (ref 4.1–5.1)
Alkaline Phosphatase: 96 IU/L (ref 44–121)
BUN/Creatinine Ratio: 12 (ref 9–20)
BUN: 13 mg/dL (ref 6–20)
Bilirubin Total: <0.2 mg/dL (ref 0.0–1.2)
CO2: 25 mmol/L (ref 20–29)
Calcium: 9.8 mg/dL (ref 8.7–10.2)
Chloride: 102 mmol/L (ref 96–106)
Creatinine, Ser: 1.08 mg/dL (ref 0.76–1.27)
Globulin, Total: 3.2 g/dL (ref 1.5–4.5)
Glucose: 95 mg/dL (ref 70–99)
Potassium: 4.6 mmol/L (ref 3.5–5.2)
Sodium: 142 mmol/L (ref 134–144)
Total Protein: 8 g/dL (ref 6.0–8.5)
eGFR: 93 mL/min/{1.73_m2} (ref >59)

## 2022-09-09 LAB — CBC WITH DIFFERENTIAL/PLATELET
Basophils Absolute: 0 10*3/uL (ref 0.0–0.2)
Basos: 1 %
EOS (ABSOLUTE): 0.1 10*3/uL (ref 0.0–0.4)
Eos: 2 %
Hematocrit: 42.2 % (ref 37.5–51.0)
Hemoglobin: 14.8 g/dL (ref 13.0–17.7)
Immature Grans (Abs): 0 10*3/uL (ref 0.0–0.1)
Immature Granulocytes: 0 %
Lymphocytes Absolute: 1.6 10*3/uL (ref 0.7–3.1)
Lymphs: 50 %
MCH: 30.4 pg (ref 26.6–33.0)
MCHC: 35.1 g/dL (ref 31.5–35.7)
MCV: 87 fL (ref 79–97)
Monocytes Absolute: 0.3 10*3/uL (ref 0.1–0.9)
Monocytes: 8 %
Neutrophils Absolute: 1.3 10*3/uL — ABNORMAL LOW (ref 1.4–7.0)
Neutrophils: 39 %
Platelets: 237 10*3/uL (ref 150–450)
RBC: 4.87 x10E6/uL (ref 4.14–5.80)
RDW: 12.2 % (ref 11.6–15.4)
WBC: 3.3 10*3/uL — ABNORMAL LOW (ref 3.4–10.8)

## 2022-09-09 LAB — CARBAMAZEPINE LEVEL, TOTAL: Carbamazepine (Tegretol), S: 3.9 ug/mL — ABNORMAL LOW (ref 4.0–12.0)

## 2022-09-09 LAB — LACOSAMIDE: Lacosamide: 10.9 ug/mL — ABNORMAL HIGH (ref 5.0–10.0)

## 2022-09-10 ENCOUNTER — Ambulatory Visit: Payer: BC Managed Care – PPO | Admitting: Family Medicine

## 2022-09-16 ENCOUNTER — Ambulatory Visit: Payer: BC Managed Care – PPO | Admitting: Family Medicine

## 2022-09-22 ENCOUNTER — Encounter: Payer: Self-pay | Admitting: Neurology

## 2022-09-22 ENCOUNTER — Ambulatory Visit: Payer: BC Managed Care – PPO | Admitting: Neurology

## 2022-09-22 VITALS — BP 134/85 | HR 80 | Ht 74.0 in | Wt 215.0 lb

## 2022-09-22 DIAGNOSIS — G40219 Localization-related (focal) (partial) symptomatic epilepsy and epileptic syndromes with complex partial seizures, intractable, without status epilepticus: Secondary | ICD-10-CM | POA: Diagnosis not present

## 2022-09-22 MED ORDER — CENOBAMATE 14 X 50 MG & 14 X100 MG PO TBPK: ORAL_TABLET | ORAL | 0 refills | Status: AC

## 2022-09-22 NOTE — Progress Notes (Signed)
PATIENT: Robert Byrd DOB: 14-Oct-1990  REASON FOR VISIT: follow up HISTORY FROM: patient  Chief Complaint  Patient presents with   Follow-up    Rm 11, with wife and 2 kids States he is stable last seizure event on 08/2022    HISTORY OF PRESENT ILLNESS:  09/22/22  [facility] Patient presents today for follow-up, he is accompanied by wife and 2 children.  At last visit in December, he had complained having a breakthrough seizure, this was in the setting of lack of sleep.  Wife reported she had surgery at that time and parents was doing everything and he was not getting enough sleep.  Parents report this was one of the worst seizure that he had had.  Seizure lasted about 10 minutes followed by postictal confusion about 25 minutes.  Since then he has not had any seizures.  He is compliant with the Tegretol and Vimpat with the addition of Xcopri.  He is interested in epilepsy surgery.    09/03/2022: [name] returns for follow up for breakthrough seizures. He has continued carbamazepine 600mg  BID and lacosamide 200mg  BID. He was last seen 04/2022 reporting an event on 05/02/22. 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 Ambulatory EEG was normal. He called to report an event 12/7 where he states he started having cold chills. This lasted for about a minute then he lost consciousness. His wife reports he had right eye deviation with stiffness of upper extremities for 1-2 minutes, R>L. Wife had surgery about a week prior and he was having to get up more at night with his children. He denies missed AED. We added Xcopri. He started titration dosing 7 days ago. He is tolerating fairly well. He has felt a little more sleepy but also reports difficulty getting to sleep for the past two nights. He feels that he slept well last night. No chest pain or palpitations. He is eating normally. He drinks about 36-40 ounces of water daily.   05/06/2022  ALL: Robert Byrd returns for acute visit for breakthrough seizure occurring 8/18. He was driving home from the store. His wife reports that he started driving fast and felt that he was getting ready to have a seizure. He states he felt his heart start to beat harder. He did not feel chills as he usually does. He was able to get home and park car. His wife states that he spaced out and was unable to answer questions. He kept repeating "I don't know". Event lasted 2-3 minutes. She was able to walk him inside. He seemed completely back to baseline after about 5 minutes. He did not seek medical attention. Denies missed doses of AED. He denies any known triggers. No lack of sleep. No stressors. No drugs or alcohol. Last dose of AED ws about 6:40am.   04/03/2022 ALL: Robert Byrd returns for follow up for seizures. He was last seen 10/2021 and reported two events that were concerning for seizure activity, last being 10/11/2021, in the setting of missed AED. EEG was normal. Carbamzepine level normal, lacosamide level was a little low. He has continued carbamazepine 600mg  BID and lacosamide 200mg  BID. Usually takes at 7am and around 10-11pm. He is tolerating well. No seizure activity. He has been working from home.   10/30/2021 ALL: Robert Byrd returns for follow up for seizures. He continues lacosamide 200mg  and carbamazepine 600mg  BID. He denies missed doses of medications. He presents with his wife, today, who states that he had  two small seizures since 09/07/2021. He reports that he felt a feeling of cold chills that came over his body. His wife reports that he did not recognize her. He was not able to respond appropriately. Wife noticed unusual movement of his tongue. He did not know his kid's names. Another event occurred during the night of 10/11/2021. His wife woke up to him "singing" or talking jibberish". She felt that he was confused but not sure if he was asleep. He rolled over and went to sleep and woke up fine. He did  not remember then event over night. Last dose was this morning. Wife mentions at the end of the visit that he reduced dose of lacosamide to 150mg  BID in November after his son accidentally threw away his new prescription. He took 150mg  BID dosing for several weeks prior to resuming 200mg  BID dose.   10/29/2020 ALL:  Robert Byrd returns today for seizure follow up. He continues lacosamide 200mg  and carbamazepine 600mg  BID. He is tolerating medicaitons well with no seizure activity. He does report more auras over the past 1-2 years. He has experienced symptoms of feeling cold all over and feeling anxious prior to seizures in the past. These symptoms usually get better when dose changes are made but return at some point, to varying degrees. No seizure activity has followed aura. Dose change last in 2020 with increased dose of lacosamide due to subtherapeutic levels. Last seizure 10/2018. He is sleeping well. No obvious changes in stress levels. His wife delivered their second son about 6 months ago. He denies missed doses of AEDs.   11/02/2019 ALL:  Robert Byrd is a 32 y.o. male here today for follow up. He is doing well. No seizure activity. He continues to tolerate Vimpat 200mg  BID and Tegretol XR 600mg  BID. No obvious adverse effects. He is expecting his second child as his wife is [redacted] weeks pregnant. He is feeling well today and without concerns.   04/28/2019 ALL: Robert Byrd is a 32 y.o. male here today for follow up for seizures. He reported breakthrough seizure thought to be related to increased stress at last visit. Labs show subtherapeutic Vimpat level and dose was increased from 150mg  BID to 200mg  BID. He also takes Tegretol XR 600mg  BID.  He reports that he is tolerating medications well with no obvious adverse effects.  He has had no seizure activity since last being seen.  He is feeling well today and without complaints.   UPDATE 2/13/2020CM Robert Byrd , 32 year old male returns for follow-up with  history of seizure disorder he reports about 3 brief seizures since last seen in April of last year.  His dosage was changed in April and said he says it worked well for about 6 to 7 months these have occurred in the last couple of months.  One was due to a stressful situation where he had to speak in front of a group.  He is currently on Tegretol-XR 200 mg 3 tablets twice daily.  He is also on Vimpat 150 twice daily.  Denies missing any doses of his medication.  He returns for reevaluation.   UPDATE 4/11/19CM Robert Byrd, 32 year old male returns for follow-up with history of seizure disorder.  He reports that he has had 4-5 seizures in the last year.  He has not kept a diary, one recently when he was in the car with his wife when  she was driving he has missed some doses of his medication.  He also reports that he  was on narcotics for back pain several months ago.  He has had therapeutic levels of his medication in the past.  He is currently taking Vimpat 150 twice daily and Tegretol 200 XR 3 in the morning and 2 at night.  He returns for reevaluation.  He knows he cannot drive for 6 months   UPDATE 04/09/2018CM Robert Byrd 32 year old male returns for follow-up of his wife. He has a history of seizure disorder starting at the age 76. He is currently on Tegretol and Vimpat without side effects. He denies missing any doses of his medication. In the past he has had seizures when missing doses of his medication. He returns for reevaluation. 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   UPDATE 10/09/2017CM Robert Byrd, 32 year old male returns for follow-up he has a history of seizure disorder. He is currently on Tegretol 200 mg 2 in the morning and 3 at night along with Vimpat 150 twice daily. He ran out of his Vimpat Saturday morning and had a seizure Sunday  Afternoon while watching football. He has not missed doses of his Tegretol. He returns for reevaluation. He states he wants to have meds increased however  the important thing is take the medication as directed and do not run out of medication. He returns for reevaluation   HISTORY AA Seizures started at age 5. Complex partial and GTCs. [facility] neurology as a child. Dr Hyacinth Meeker did an MRI of the brain last when he was [age]. Last EEG when he was 4. Seizures started with staring. He has an aura, starts to feel cold on the inside of his body, he tries to talk a lot, he "passes out". Mother provides most information. He starts salivating, starts moving his tongue around, he can't answer the simplest questions, he starts chewing a lot, staring off. Last GTCS was at age 56, denies missing dosages. He has been on tegretol all his life. Then they added Vimpat. Has had >3 events in the last month, woke up after urinating in the bed. Girlfriend said he had an episode of talking and saying strange things, afterwards is tired and has to sleep. Not missing doses. Previous to this, he has been controlled ok, maybe one seizure a month. Recently there was an error in his medications, he was on 100mg  vimpat bid and was changed to 50mg  bid and maybe that increased the seizures however was still having them once a month previously.    Also having daily pressure type headaches. Worsening. No weakness. No focal neurologic symptoms. Takes OTC medications daily.    REVIEW OF SYSTEMS: Out of a complete 14 system review of symptoms, the patient complains only of the following symptoms, auras, seizure and all other reviewed systems are negative.  ALLERGIES: No Known Allergies  HOME MEDICATIONS: Outpatient Medications Prior to Visit  Medication Sig Dispense Refill   carbamazepine (TEGRETOL XR) 200 MG 12 hr tablet Take 3 tablets (600 mg total) by mouth 2 (two) times daily. 3 pills twice daily. 540 tablet 3   fexofenadine (ALLEGRA) 180 MG tablet Take 180 mg by mouth daily.     Fluticasone-Salmeterol (ADVAIR) 250-50 MCG/DOSE AEPB Inhale 1 puff into the lungs 2 (two) times daily.      lacosamide (VIMPAT) 200 MG TABS tablet Take 1 tablet (200 mg total) by mouth 2 (two) times daily. 180 tablet 1   montelukast (SINGULAIR) 10 MG tablet Take 10 mg by mouth at bedtime.     Cenobamate (XCOPRI) 14 x 12.5  MG & 14 x 25 MG TBPK Take 12.5mg  daily for 2 weeks then increase dose to 25mg  daily for two weeks. 1 each 0   No facility-administered medications prior to visit.    PAST MEDICAL HISTORY: Past Medical History:  Diagnosis Date   Seizure (HCC) 1993    PAST SURGICAL HISTORY: History reviewed. No pertinent surgical history.  FAMILY HISTORY: Family History  Problem Relation Age of Onset   Hypertension Maternal Grandmother    Diabetes Maternal Grandfather     SOCIAL HISTORY: Social History   Socioeconomic History   Marital status: Married    Spouse name:   Number of children: 1   Years of education: Bachelor's   Highest education level: Not on file  Occupational History   Occupation: [employer]  Tobacco Use   Smoking status: Never   Smokeless tobacco: Never  Substance and Sexual Activity   Alcohol use: No    Alcohol/week: 0.0 standard drinks of alcohol   Drug use: No   Sexual activity: Not on file  Other Topics Concern   Not on file  Social History Narrative   Live at with home with wife, child.   Right handed.   Caffeine use: Drinks soda occassionally    Social Determinants of [place] Strain: Not on file  Food Insecurity: Not on file  Transportation Needs: Not on file  Physical Activity: Not on file  Stress: Not on file  Social Connections: Not on file  Intimate Partner Violence: Not on file      PHYSICAL EXAM  Vitals:   09/22/22 1601  BP: 134/85  Pulse: 80  Weight: 215 lb (97.5 kg)  Height: 6\' 2"  (1.88 m)     Body mass index is 27.6 kg/m.  Generalized: Well developed, in no acute distress  Cardiology: normal rate and rhythm, no murmur noted Neurological examination  Mentation: Alert oriented to  time, place, history taking. Follows all commands speech and language fluent Cranial nerve II-XII: Pupils were equal round reactive to light. Extraocular movements were full, visual field were full on confrontational test. Facial sensation and strength were normal. Head turning and shoulder shrug  were normal and symmetric. Motor: The motor testing reveals 5 over 5 strength of all 4 extremities. Good symmetric motor tone is noted throughout.  Sensory: Sensory testing is intact to soft touch on all 4 extremities. No evidence of extinction is noted.  Coordination: Cerebellar testing reveals good finger-nose-finger and heel-to-shin bilaterally.  Gait and station: Gait is normal.    DIAGNOSTIC DATA (LABS, IMAGING, TESTING) - I reviewed patient records, labs, notes, testing and imaging myself where available.      No data to display           Lab Results  Component Value Date   WBC 3.3 (L) 09/03/2022   HGB 14.8 09/03/2022   HCT 42.2 09/03/2022   MCV 87 09/03/2022   PLT 237 09/03/2022      Component Value Date/Time   NA 142 09/03/2022 0839   K 4.6 09/03/2022 0839   CL 102 09/03/2022 0839   CO2 25 09/03/2022 0839   GLUCOSE 95 09/03/2022 0839   BUN 13 09/03/2022 0839   CREATININE 1.08 09/03/2022 0839   CALCIUM 9.8 09/03/2022 0839   PROT 8.0 09/03/2022 0839   ALBUMIN 4.8 09/03/2022 0839   AST 21 09/03/2022 0839   ALT 26 09/03/2022 0839   ALKPHOS 96 09/03/2022 0839   BILITOT <0.2 09/03/2022 09/05/2022  GFRNONAA 85 10/29/2020 1612   GFRAA 99 10/29/2020 1612   No results found for: "CHOL", "HDL", "LDLCALC", "LDLDIRECT", "TRIG", "CHOLHDL" No results found for: "HGBA1C" Lab Results  Component Value Date   VITAMINB12 397 12/20/2014   No results found for: "TSH"   Routine EEG 2016 normal  Routine EEG 2023 normal  Ambulatory EEG 2023 normal, no seizures no events   MRI Brain 05/31/2022 Subtle FLAIR hyperintensity within the hippocampus on the left.  This was also observed on  the MRI from 01/04/2015 and is a little less apparent on today's study.  Hippocampal volume appears symmetric.  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 No acute findings.  Normal enhancement pattern.   ASSESSMENT AND PLAN 32 y.o. year old male  has a past medical history of Seizure (Early) (1993). here with     ICD-10-CM   1. Partial symptomatic epilepsy with complex partial seizures, intractable, without status epilepticus ([place])  G40.219 Ambulatory referral to Neurology      Since the addition of Xcopri he has not had any additional seizures.  He is interested in epilepsy surgery.  Spent additional time discussing all surgical options, presurgical workup including phase 1 and phase 2, need for neuropsych testing and additional testings.  Patient voices understanding and state that he is interested and he would like to move forward.  I will refer him to [facility] for surgical evaluation.  He voices understanding.  I will continue to follow-up with him.  We will continue the Xcopri titration with goal of 150 mg daily.  I will see him in 3 months for follow-up.  Advised him to contact me sooner if he has any breakthrough seizure.   Orders Placed This Encounter  Procedures   Ambulatory referral to Neurology    Referral Priority:   Routine    Referral Type:   Consultation    Referral Reason:   Specialty Services Required    Referred to Provider:   Roque Cash, MD    Requested Specialty:   Neurology    Number of Visits Requested:   1     Meds ordered this encounter  Medications   Cenobamate 14 x 50 MG & 14 x100 MG TBPK    Sig: Take 50 mg by mouth daily for 14 days, THEN 100 mg daily for 14 days.    Dispense:  28 each    Refill:  0     I have spent a total of 45 minutes dedicated to this patient today, preparing to see patient, performing a medically appropriate examination and evaluation, ordering tests and/or medications and procedures, and  counseling and educating the patient/family/caregiver; independently interpreting result and communicating results to the family/patient/caregiver; and documenting clinical information in the electronic medical record.    Alric Ran, MD 09/22/2022, 5:02 PM [facility] [address] [place], [place] [address] [phone]

## 2022-09-23 ENCOUNTER — Telehealth: Payer: Self-pay | Admitting: Neurology

## 2022-09-23 NOTE — Telephone Encounter (Signed)
Referral for Neurology fax to [facility]  Phone: [phone], Fax: [phone]

## 2022-09-25 ENCOUNTER — Encounter: Payer: Self-pay | Admitting: Neurology

## 2022-09-29 DIAGNOSIS — Z0289 Encounter for other administrative examinations: Secondary | ICD-10-CM

## 2022-09-30 ENCOUNTER — Telehealth: Payer: Self-pay | Admitting: *Deleted

## 2022-09-30 NOTE — Telephone Encounter (Signed)
Received accomodation request for pt to work from home for the next 6 months due to driving restrictions.  Placed pw on MD's desk for review/signature if agreeable.

## 2022-09-30 NOTE — Telephone Encounter (Signed)
Md reviewed and signed pw, I have provided forms to medical records for processing.

## 2022-09-30 NOTE — Telephone Encounter (Signed)
Pt [place] [employer] faxed on 09/30/2022

## 2022-10-06 ENCOUNTER — Other Ambulatory Visit: Payer: Self-pay | Admitting: *Deleted

## 2022-10-06 DIAGNOSIS — G40209 Localization-related (focal) (partial) symptomatic epilepsy and epileptic syndromes with complex partial seizures, not intractable, without status epilepticus: Secondary | ICD-10-CM

## 2022-10-06 MED ORDER — LACOSAMIDE 200 MG PO TABS: 200.0000 mg | ORAL_TABLET | Freq: Two times a day (BID) | ORAL | 2 refills | Status: DC

## 2022-10-06 NOTE — Telephone Encounter (Signed)
Received fax from [facility] requesting refill on Vimpat. Pt last saw Dr. April Manson 09/22/22. Next f/u 04/06/23.  Per drug registry, last refilled 07/08/22 #[id number].

## 2022-10-22 ENCOUNTER — Other Ambulatory Visit: Payer: Self-pay | Admitting: Neurology

## 2022-10-22 MED ORDER — CENOBAMATE 150 MG PO TABS: 150.0000 mg | ORAL_TABLET | Freq: Every day | ORAL | 5 refills | Status: DC

## 2022-10-22 NOTE — Telephone Encounter (Signed)
Yes, I will send a prescription for 150 mg. Thanks

## 2022-12-02 ENCOUNTER — Other Ambulatory Visit: Payer: Self-pay

## 2022-12-02 DIAGNOSIS — G40209 Localization-related (focal) (partial) symptomatic epilepsy and epileptic syndromes with complex partial seizures, not intractable, without status epilepticus: Secondary | ICD-10-CM

## 2022-12-02 MED ORDER — CARBAMAZEPINE ER 200 MG PO TB12: 600.0000 mg | ORAL_TABLET | Freq: Two times a day (BID) | ORAL | 1 refills | Status: DC

## 2023-01-05 ENCOUNTER — Ambulatory Visit: Payer: BC Managed Care – PPO | Admitting: Neurology

## 2023-01-05 ENCOUNTER — Encounter: Payer: Self-pay | Admitting: Neurology

## 2023-01-05 VITALS — BP 130/80 | HR 66 | Ht 73.0 in | Wt 207.0 lb

## 2023-01-05 DIAGNOSIS — G40209 Localization-related (focal) (partial) symptomatic epilepsy and epileptic syndromes with complex partial seizures, not intractable, without status epilepticus: Secondary | ICD-10-CM

## 2023-01-05 NOTE — Patient Instructions (Signed)
Continue with Xcopri 150 mg daily Continue with Tegretol 600 mg twice daily Continue with Vimpat 200 mg twice daily Continue to follow-up with [facility] as scheduled Follow-up in 6 months or sooner if worse

## 2023-01-05 NOTE — Progress Notes (Signed)
PATIENT: Robert Byrd DOB: 17-Aug-1991  REASON FOR VISIT: follow up HISTORY FROM: patient  Chief Complaint  Patient presents with   Follow-up    Rm 13. Alone. No new concerns. No new seizure activity.   HISTORY OF PRESENT ILLNESS:  01/05/23   Patient presents today for follow-up, he is alone.  Last visit was in January.  Since then he reports that he is doing very well, no seizure or seizure-like activity.  He is pending his appointment at [facility] epilepsy center scheduled first week of May. He is compliant with his medications, denies any side effect   10/02/2022: AC Patient presents today for follow-up, he is accompanied by wife and 2 children.  At last visit in December, he had complained having a breakthrough seizure, this was in the setting of lack of sleep.  Wife reported she had surgery at that time and parents was doing everything and he was not getting enough sleep.  Parents report this was one of the worst seizure that he had had.  Seizure lasted about 10 minutes followed by postictal confusion about 25 minutes.  Since then he has not had any seizures.  He is compliant with the Tegretol and Vimpat with the addition of Xcopri.  He is interested in epilepsy surgery.   09/03/2022: [name] returns for follow up for breakthrough seizures. He has continued carbamazepine  BID and lacosamide  BID. He was last seen 04/2022 reporting an event on 05/02/22. 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 Ambulatory EEG was normal. He called to report an event 12/7 where he states he started having cold chills. This lasted for about a minute then he lost consciousness. His wife reports he had right eye deviation with stiffness of upper extremities for 1-2 minutes, R>L. Wife had surgery about a week prior and he was having to get up more at night with his children. He denies missed AED. We added Xcopri. He started titration dosing 7  days ago. He is tolerating fairly well. He has felt a little more sleepy but also reports difficulty getting to sleep for the past two nights. He feels that he slept well last night. No chest pain or palpitations. He is eating normally. He drinks about 36-40 ounces of water daily.   05/06/2022 ALL: Robert Byrd returns for acute visit for breakthrough seizure occurring 8/18. He was driving home from the store. His wife reports that he started driving fast and felt that he was getting ready to have a seizure. He states he felt his heart start to beat harder. He did not feel chills as he usually does. He was able to get home and park car. His wife states that he spaced out and was unable to answer questions. He kept repeating "I don't know". Event lasted 2-3 minutes. She was able to walk him inside. He seemed completely back to baseline after about 5 minutes. He did not seek medical attention. Denies missed doses of AED. He denies any known triggers. No lack of sleep. No stressors. No drugs or alcohol. Last dose of AED ws about 6:40am.   04/03/2022 ALL: Robert Byrd returns for follow up for seizures. He was last seen 10/2021 and reported two events that were concerning for seizure activity, last being 10/11/2021, in the setting of missed AED. EEG was normal. Carbamzepine level normal, lacosamide level was a little low. He has continued carbamazepine  BID and lacosamide  BID. Usually takes at 7am and around  10-11pm. He is tolerating well. No seizure activity. He has been working from home.   10/30/2021 ALL: Robert Byrd returns for follow up for seizures. He continues lacosamide  and carbamazepine  BID. He denies missed doses of medications. He presents with his wife, today, who states that he had two small seizures since 09/07/2021. He reports that he felt a feeling of cold chills that came over his body. His wife reports that he did not recognize her. He was not able to respond appropriately. Wife noticed  unusual movement of his tongue. He did not know his kid's names. Another event occurred during the night of 10/11/2021. His wife woke up to him "singing" or talking jibberish". She felt that he was confused but not sure if he was asleep. He rolled over and went to sleep and woke up fine. He did not remember then event over night. Last dose was this morning. Wife mentions at the end of the visit that he reduced dose of lacosamide to  BID in November after his son accidentally threw away his new prescription. He took  BID dosing for several weeks prior to resuming  BID dose.   10/29/2020 ALL:  Robert Byrd returns today for seizure follow up. He continues lacosamide  and carbamazepine  BID. He is tolerating medicaitons well with no seizure activity. He does report more auras over the past 1-2 years. He has experienced symptoms of feeling cold all over and feeling anxious prior to seizures in the past. These symptoms usually get better when dose changes are made but return at some point, to varying degrees. No seizure activity has followed aura. Dose change last in 2020 with increased dose of lacosamide due to subtherapeutic levels. Last seizure 10/2018. He is sleeping well. No obvious changes in stress levels. His wife delivered their second son about 6 months ago. He denies missed doses of AEDs.   11/02/2019 ALL:  Robert Byrd is a 32 y.o. male here today for follow up. He is doing well. No seizure activity. He continues to tolerate Vimpat  BID and Tegretol XR  BID. No obvious adverse effects. He is expecting his second child as his wife is [redacted] weeks pregnant. He is feeling well today and without concerns.   04/28/2019 ALL: Robert Byrd is a 32 y.o. male here today for follow up for seizures. He reported breakthrough seizure thought to be related to increased stress at last visit. Labs show subtherapeutic Vimpat level and dose was increased from  BID to  BID. He also  takes Tegretol XR  BID.  He reports that he is tolerating medications well with no obvious adverse effects.  He has had no seizure activity since last being seen.  He is feeling well today and without complaints.   UPDATE 2/13/2020CM Robert Byrd , 32 year old male returns for follow-up with history of seizure disorder he reports about 3 brief seizures since last seen in April of last year.  His dosage was changed in April and said he says it worked well for about 6 to 7 months these have occurred in the last couple of months.  One was due to a stressful situation where he had to speak in front of a group.  He is currently on Tegretol-XR 200 mg 3 tablets twice daily.  He is also on Vimpat 150 twice daily.  Denies missing any doses of his medication.  He returns for reevaluation.   UPDATE 4/11/19CM Robert Byrd, 32 year old male returns for follow-up with history of seizure  disorder.  He reports that he has had 4-5 seizures in the last year.  He has not kept a diary, one recently when he was in the car with his wife when  she was driving he has missed some doses of his medication.  He also reports that he was on narcotics for back pain several months ago.  He has had therapeutic levels of his medication in the past.  He is currently taking Vimpat 150 twice daily and Tegretol 200 XR 3 in the morning and 2 at night.  He returns for reevaluation.  He knows he cannot drive for 6 months   UPDATE 04/09/2018CM Robert Byrd 32 year old male returns for follow-up of his wife. He has a history of seizure disorder starting at the age 41. He is currently on Tegretol and Vimpat without side effects. He denies missing any doses of his medication. In the past he has had seizures when missing doses of his medication. He returns for reevaluation. 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   UPDATE 10/09/2017CM Robert Byrd, 32 year old male returns for follow-up he has a history of seizure disorder. He is currently on Tegretol  200 mg 2 in the morning and 3 at night along with Vimpat 150 twice daily. He ran out of his Vimpat Saturday morning and had a seizure Sunday  Afternoon while watching football. He has not missed doses of his Tegretol. He returns for reevaluation. He states he wants to have meds increased however the important thing is take the medication as directed and do not run out of medication. He returns for reevaluation   HISTORY AA Seizures started at age 60. Complex partial and GTCs. [facility] neurology as a child. Dr Hyacinth Meeker did an MRI of the brain last when he was [age]. Last EEG when he was 4. Seizures started with staring. He has an aura, starts to feel cold on the inside of his body, he tries to talk a lot, he "passes out". Mother provides most information. He starts salivating, starts moving his tongue around, he can't answer the simplest questions, he starts chewing a lot, staring off. Last GTCS was at age 62, denies missing dosages. He has been on tegretol all his life. Then they added Vimpat. Has had >3 events in the last month, woke up after urinating in the bed. Girlfriend said he had an episode of talking and saying strange things, afterwards is tired and has to sleep. Not missing doses. Previous to this, he has been controlled ok, maybe one seizure a month. Recently there was an error in his medications, he was on 100mg  vimpat bid and was changed to 50mg  bid and maybe that increased the seizures however was still having them once a month previously.    Also having daily pressure type headaches. Worsening. No weakness. No focal neurologic symptoms. Takes OTC medications daily.    REVIEW OF SYSTEMS: Out of a complete 14 system review of symptoms, the patient complains only of the following symptoms, auras, seizure and all other reviewed systems are negative.  ALLERGIES: No Known Allergies  HOME MEDICATIONS: Outpatient Medications Prior to Visit  Medication Sig Dispense Refill   carbamazepine (TEGRETOL  XR) 200 MG 12 hr tablet Take 3 tablets (600 mg total) by mouth 2 (two) times daily. 3 pills twice daily. 540 tablet 1   Cenobamate 150 MG TABS Take 1 tablet (150 mg total) by mouth daily. 30 tablet 5   fexofenadine (ALLEGRA) 180 MG tablet Take 180 mg by  mouth daily.     Fluticasone-Salmeterol (ADVAIR) 250-50 MCG/DOSE AEPB Inhale 1 puff into the lungs 2 (two) times daily.     lacosamide (VIMPAT) 200 MG TABS tablet Take 1 tablet (200 mg total) by mouth 2 (two) times daily. 180 tablet 2   montelukast (SINGULAIR) 10 MG tablet Take 10 mg by mouth at bedtime.     No facility-administered medications prior to visit.    PAST MEDICAL HISTORY: Past Medical History:  Diagnosis Date   Seizure 1993    PAST SURGICAL HISTORY: History reviewed. No pertinent surgical history.  FAMILY HISTORY: Family History  Problem Relation Age of Onset   Hypertension Maternal Grandmother    Diabetes Maternal Grandfather     SOCIAL HISTORY: Social History   Socioeconomic History   Marital status: Married    Spouse name: Sue Lush   Number of children: 1   Years of education: Bachelor's   Highest education level: Not on file  Occupational History   Occupation: [employer]  Tobacco Use   Smoking status: Never   Smokeless tobacco: Never  Substance and Sexual Activity   Alcohol use: No    Alcohol/week: 0.0 standard drinks of alcohol   Drug use: No   Sexual activity: Not on file  Other Topics Concern   Not on file  Social History Narrative   Live at with home with wife, child.   Right handed.   Caffeine use: Drinks soda occassionally    Social Determinants of Corporate investment banker Strain: Not on file  Food Insecurity: Not on file  Transportation Needs: Not on file  Physical Activity: Not on file  Stress: Not on file  Social Connections: Not on file  Intimate Partner Violence: Not on file      PHYSICAL EXAM  Vitals:   01/05/23 1551  BP: 130/80  Pulse: 66  Weight: 207 lb (93.9  kg)  Height: 6\' 1"  (1.854 m)     Body mass index is 27.31 kg/m.  Generalized: Well developed, in no acute distress  Cardiology: normal rate and rhythm, no murmur noted Neurological examination  Mentation: Alert oriented to time, place, history taking. Follows all commands speech and language fluent Cranial nerve II-XII: Pupils were equal round reactive to light. Extraocular movements were full, visual field were full on confrontational test. Facial sensation and strength were normal. Head turning and shoulder shrug  were normal and symmetric. Motor: The motor testing reveals 5 over 5 strength of all 4 extremities. Good symmetric motor tone is noted throughout.  Sensory: Sensory testing is intact to soft touch on all 4 extremities. No evidence of extinction is noted.  Coordination: Cerebellar testing reveals good finger-nose-finger and heel-to-shin bilaterally.  Gait and station: Gait is normal.    DIAGNOSTIC DATA (LABS, IMAGING, TESTING) - I reviewed patient records, labs, notes, testing and imaging myself where available.      No data to display           Lab Results  Component Value Date   WBC 3.3 (L) 09/03/2022   HGB 14.8 09/03/2022   HCT 42.2 09/03/2022   MCV 87 09/03/2022   PLT 237 09/03/2022      Component Value Date/Time   NA 142 09/03/2022 0839   K 4.6 09/03/2022 0839   CL 102 09/03/2022 0839   CO2 25 09/03/2022 0839   GLUCOSE 95 09/03/2022 0839   BUN 13 09/03/2022 0839   CREATININE 1.08 09/03/2022 0839   CALCIUM 9.8 09/03/2022 0839  PROT 8.0 09/03/2022 0839   ALBUMIN 4.8 09/03/2022 0839   AST 21 09/03/2022 0839   ALT 26 09/03/2022 0839   ALKPHOS 96 09/03/2022 0839   BILITOT <0.2 09/03/2022 0839   GFRNONAA 85 10/29/2020 1612   GFRAA 99 10/29/2020 1612   No results found for: "CHOL", "HDL", "LDLCALC", "LDLDIRECT", "TRIG", "CHOLHDL" No results found for: "HGBA1C" Lab Results  Component Value Date   VITAMINB12 397 12/20/2014   No results found for:  "TSH"   Routine EEG 2016 normal  Routine EEG 2023 normal  Ambulatory EEG 2023 normal, no seizures no events   MRI Brain 05/31/2022 Subtle FLAIR hyperintensity within the hippocampus on the left.  This was also observed on the MRI from 01/04/2015 and is a little less apparent on today's study.  Hippocampal volume appears symmetric.  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 No acute findings.  Normal enhancement pattern.   ASSESSMENT AND PLAN 32 y.o. year old male  has a past medical history of Seizure (1993). here with     ICD-10-CM   1. Partial symptomatic epilepsy with complex partial seizures, not intractable, without status epilepticus  G40.209      Since the addition of Xcopri he has not had any additional seizures. Currently he is Xcopri 150 mg daily.  He is interested in epilepsy surgery, pending appointment scheduled first week of May.  Continue medication including Xcopri 150 mg daily, Vimpat 200 mg twice daily, and Tegretol XR 600 mg twice daily.  I will see him in 6 months for follow-up.  He voices understanding   Patient Instructions  Continue with Xcopri 150 mg daily Continue with Tegretol 600 mg twice daily Continue with Vimpat 200 mg twice daily Continue to follow-up with [facility] as scheduled Follow-up in 6 months or sooner if worse    No orders of the defined types were placed in this encounter.    No orders of the defined types were placed in this encounter.    Windell Norfolk, MD 01/05/2023, 5:27 PM [facility] [address], [address] [place], [place] [address] [phone]

## 2023-01-07 ENCOUNTER — Ambulatory Visit: Payer: BC Managed Care – PPO | Admitting: Family Medicine

## 2023-02-08 ENCOUNTER — Encounter: Payer: Self-pay | Admitting: Neurology

## 2023-02-23 ENCOUNTER — Encounter: Payer: Self-pay | Admitting: Neurology

## 2023-02-23 DIAGNOSIS — Z0289 Encounter for other administrative examinations: Secondary | ICD-10-CM

## 2023-02-23 NOTE — Telephone Encounter (Signed)
Yes, we will complete the form. Thanks

## 2023-03-02 ENCOUNTER — Other Ambulatory Visit: Payer: Self-pay | Admitting: Neurology

## 2023-03-02 NOTE — Telephone Encounter (Signed)
Requested Prescriptions   Pending Prescriptions Disp Refills   XCOPRI 150 MG TABS [Pharmacy Med Name: XCOPRI 150 MG TABLET] 30 tablet 5    Sig: TAKE 1 TABLET BY MOUTH EVERY DAY   Last seen 01/05/23, next appt scheduled 07/08/23 Dispenses   Dispensed Days Supply Quantity Provider Pharmacy  XCOPRI 150 MG TABLET 02/02/2023 30 30 each Windell Norfolk, MD [facility] [phone] - [facility]...  XCOPRI 150 MG TABLET 12/31/2022 30 30 each Windell Norfolk, MD [facility] [phone] - [facility]...  XCOPRI 150 MG TABLET 12/01/2022 30 30 each Windell Norfolk, MD [facility] [phone] - [facility]...  XCOPRI 150 MG TABLET 11/03/2022 30 30 each Windell Norfolk, MD [facility] [phone] - [facility]...  XCOPRI 50-100 MG TITRATION PAK 09/24/2022 28 28 each Windell Norfolk, MD [facility] [phone] - [facility]...  XCOPRI 12.5-25 MG TITRATION PK 08/27/2022 28 28 each Lomax, Amy, NP [id number] - [facility]

## 2023-03-30 ENCOUNTER — Encounter: Payer: Self-pay | Admitting: Neurology

## 2023-03-30 NOTE — Telephone Encounter (Signed)
He can reschedule at a later date. We can refill his meds.

## 2023-04-04 ENCOUNTER — Encounter: Payer: Self-pay | Admitting: Neurology

## 2023-04-04 NOTE — Progress Notes (Signed)
Robert Byrd has a full neuropsychological testing done during this presurgical workup on 02/10/2023 Overall Robert Byrd neurocognitive profile is abnormal due to impairment of verbal learning and memory, as described above.  He has a very strong lateralized and localized pattern, suggestive of left (mesial) temporal dysfunction.  Etiology is likely related to an area of seizure focus.  From a cognitive perspective, he will likely be a good surgical candidate as they will be at low risk for additional memory changes based on current findings.  Full note will be scanned

## 2023-04-06 ENCOUNTER — Ambulatory Visit: Payer: BC Managed Care – PPO | Admitting: Family Medicine

## 2023-05-02 ENCOUNTER — Other Ambulatory Visit: Payer: Self-pay | Admitting: Neurology

## 2023-05-02 DIAGNOSIS — G40209 Localization-related (focal) (partial) symptomatic epilepsy and epileptic syndromes with complex partial seizures, not intractable, without status epilepticus: Secondary | ICD-10-CM

## 2023-05-04 NOTE — Telephone Encounter (Signed)
Requested Prescriptions   Pending Prescriptions Disp Refills   lacosamide (VIMPAT) 200 MG TABS tablet [Pharmacy Med Name: LACOSAMIDE 200 MG TABLET] 180 tablet     Sig: TAKE 1 TABLET BY MOUTH TWICE A DAY   Last seen 01/05/23, next appt 07/08/23 Dispenses    Dispensed Days Supply Quantity Provider Pharmacy  LACOSAMIDE 200 MG TABLET 01/22/2023 90 180 each Windell Norfolk, MD [facility] [phone] - G...  LACOSAMIDE 200 MG TABLET 10/06/2022 90 180 each Windell Norfolk, MD [facility] [phone] - G...  LACOSAMIDE 200 MG TABLET 07/08/2022 90 180 each Lomax, Amy, NP [id number] - [facility]

## 2023-06-11 ENCOUNTER — Other Ambulatory Visit: Payer: Self-pay

## 2023-06-11 ENCOUNTER — Other Ambulatory Visit: Payer: Self-pay | Admitting: Neurology

## 2023-06-11 DIAGNOSIS — G40209 Localization-related (focal) (partial) symptomatic epilepsy and epileptic syndromes with complex partial seizures, not intractable, without status epilepticus: Secondary | ICD-10-CM

## 2023-06-11 MED ORDER — CARBAMAZEPINE ER 200 MG PO TB12: 600.0000 mg | ORAL_TABLET | Freq: Two times a day (BID) | ORAL | 0 refills | Status: DC

## 2023-06-12 ENCOUNTER — Encounter: Payer: Self-pay | Admitting: Neurology

## 2023-07-08 ENCOUNTER — Encounter: Payer: Self-pay | Admitting: Neurology

## 2023-07-08 ENCOUNTER — Ambulatory Visit (INDEPENDENT_AMBULATORY_CARE_PROVIDER_SITE_OTHER): Payer: BC Managed Care – PPO | Admitting: Neurology

## 2023-07-08 VITALS — BP 135/90 | HR 80 | Ht 73.0 in | Wt 201.0 lb

## 2023-07-08 DIAGNOSIS — G40209 Localization-related (focal) (partial) symptomatic epilepsy and epileptic syndromes with complex partial seizures, not intractable, without status epilepticus: Secondary | ICD-10-CM | POA: Diagnosis not present

## 2023-07-08 NOTE — Patient Instructions (Signed)
Continue current medications including Xcopri 150 mg daily Vimpat 200 mg twice daily and Tegretol 600 mg twice daily.   Please do contact me if you do have your surgery date Follow-up in 1 year or sooner if worse.

## 2023-07-08 NOTE — Progress Notes (Signed)
PATIENT: Andriy Purser DOB: [birth year]  REASON FOR VISIT: follow up HISTORY FROM: patient  Chief Complaint  Patient presents with   Seizures    Rm12, wife, and 34 yr old son present, Sz: 06/15/23, no problems/concerns   HISTORY OF PRESENT ILLNESS:  07/08/23   Patient presents today for follow-up, last visit was in April, since then he had his EMU admission at [facility].  The first admission, they only capture 1 seizure so he had to go back a second time.  During the second admission they captured a total of 3 seizures and he is planning for epilepsy surgery hopefully early next year.  He is compliant with his medication including Tegretol, Vimpat and Xcopri while on this combination he has not had any additional seizures except the provoked seizure during his EMU admission.  Donah Driver has a full neuropsychological testing done during this presurgical workup on 02/10/2023 Overall Mr. Jamesetta Orleans neurocognitive profile is abnormal due to impairment of verbal learning and memory, as described above.  He has a very strong lateralized and localized pattern, suggestive of left (mesial) temporal dysfunction.  Etiology is likely related to an area of seizure focus.  From a cognitive perspective, he will likely be a good surgical candidate as they will be at low risk for additional memory changes based on current findings.   INTERVAL HISTORY 01/05/2023 Patient presents today for follow-up, he is alone.  Last visit was in January.  Since then he reports that he is doing very well, no seizure or seizure-like activity.  He is pending his appointment at [facility] epilepsy center scheduled first week of May. He is compliant with his medications, denies any side effect   10/02/2022: AC Patient presents today for follow-up, he is accompanied by wife and 2 children.  At last visit in December, he had complained having a breakthrough seizure, this was in the setting of lack of sleep.  Wife reported she had surgery at that  time and parents was doing everything and he was not getting enough sleep.  Parents report this was one of the worst seizure that he had had.  Seizure lasted about 10 minutes followed by postictal confusion about 25 minutes.  Since then he has not had any seizures.  He is compliant with the Tegretol and Vimpat with the addition of Xcopri.  He is interested in epilepsy surgery.   09/03/2022: ALLHarriett Sine returns for follow up for breakthrough seizures. He has continued carbamazepine 600mg  BID and lacosamide 200mg  BID. He was last seen 04/2022 reporting an event on 05/02/22. 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 Ambulatory EEG was normal. He called to report an event 12/7 where he states he started having cold chills. This lasted for about a minute then he lost consciousness. His wife reports he had right eye deviation with stiffness of upper extremities for 1-2 minutes, R>L. Wife had surgery about a week prior and he was having to get up more at night with his children. He denies missed AED. We added Xcopri. He started titration dosing 7 days ago. He is tolerating fairly well. He has felt a little more sleepy but also reports difficulty getting to sleep for the past two nights. He feels that he slept well last night. No chest pain or palpitations. He is eating normally. He drinks about 36-40 ounces of water daily.   05/06/2022 ALL: Shaquil returns for acute visit for breakthrough seizure occurring 8/18. He was driving home from  the store. His wife reports that he started driving fast and felt that he was getting ready to have a seizure. He states he felt his heart start to beat harder. He did not feel chills as he usually does. He was able to get home and park car. His wife states that he spaced out and was unable to answer questions. He kept repeating "I don't know". Event lasted 2-3 minutes. She was able to walk him inside. He seemed completely back to  baseline after about 5 minutes. He did not seek medical attention. Denies missed doses of AED. He denies any known triggers. No lack of sleep. No stressors. No drugs or alcohol. Last dose of AED ws about 6:40am.   04/03/2022 ALL: Jaycub returns for follow up for seizures. He was last seen 10/2021 and reported two events that were concerning for seizure activity, last being 10/11/2021, in the setting of missed AED. EEG was normal. Carbamzepine level normal, lacosamide level was a little low. He has continued carbamazepine 600mg  BID and lacosamide 200mg  BID. Usually takes at 7am and around 10-11pm. He is tolerating well. No seizure activity. He has been working from home.   10/30/2021 ALL: Mr Gaulding returns for follow up for seizures. He continues lacosamide 200mg  and carbamazepine 600mg  BID. He denies missed doses of medications. He presents with his wife, today, who states that he had two small seizures since 09/07/2021. He reports that he felt a feeling of cold chills that came over his body. His wife reports that he did not recognize her. He was not able to respond appropriately. Wife noticed unusual movement of his tongue. He did not know his kid's names. Another event occurred during the night of 10/11/2021. His wife woke up to him "singing" or talking jibberish". She felt that he was confused but not sure if he was asleep. He rolled over and went to sleep and woke up fine. He did not remember then event over night. Last dose was this morning. Wife mentions at the end of the visit that he reduced dose of lacosamide to 150mg  BID in November after his son accidentally threw away his new prescription. He took 150mg  BID dosing for several weeks prior to resuming 200mg  BID dose.   10/29/2020 ALL:  Edi returns today for seizure follow up. He continues lacosamide 200mg  and carbamazepine 600mg  BID. He is tolerating medicaitons well with no seizure activity. He does report more auras over the past 1-2 years. He  has experienced symptoms of feeling cold all over and feeling anxious prior to seizures in the past. These symptoms usually get better when dose changes are made but return at some point, to varying degrees. No seizure activity has followed aura. Dose change last in 2020 with increased dose of lacosamide due to subtherapeutic levels. Last seizure 10/2018. He is sleeping well. No obvious changes in stress levels. His wife delivered their second son about 6 months ago. He denies missed doses of AEDs.   11/02/2019 ALL:  Blase Batley is a 32 y.o. male here today for follow up. He is doing well. No seizure activity. He continues to tolerate Vimpat 200mg  BID and Tegretol XR 600mg  BID. No obvious adverse effects. He is expecting his second child as his wife is [redacted] weeks pregnant. He is feeling well today and without concerns.   04/28/2019 ALL: Labon Font is a 32 y.o. male here today for follow up for seizures. He reported breakthrough seizure thought to be related to increased stress at  last visit. Labs show subtherapeutic Vimpat level and dose was increased from 150mg  BID to 200mg  BID. He also takes Tegretol XR 600mg  BID.  He reports that he is tolerating medications well with no obvious adverse effects.  He has had no seizure activity since last being seen.  He is feeling well today and without complaints.   UPDATE 2/13/2020CM Mr. Knott , 32 year old male returns for follow-up with history of seizure disorder he reports about 3 brief seizures since last seen in April of last year.  His dosage was changed in April and said he says it worked well for about 6 to 7 months these have occurred in the last couple of months.  One was due to a stressful situation where he had to speak in front of a group.  He is currently on Tegretol-XR 200 mg 3 tablets twice daily.  He is also on Vimpat 150 twice daily.  Denies missing any doses of his medication.  He returns for reevaluation.   UPDATE 4/11/19CM Mr. Sieker, 32 year old  male returns for follow-up with history of seizure disorder.  He reports that he has had 4-5 seizures in the last year.  He has not kept a diary, one recently when he was in the car with his wife when  she was driving he has missed some doses of his medication.  He also reports that he was on narcotics for back pain several months ago.  He has had therapeutic levels of his medication in the past.  He is currently taking Vimpat 150 twice daily and Tegretol 200 XR 3 in the morning and 2 at night.  He returns for reevaluation.  He knows he cannot drive for 6 months   UPDATE 04/09/2018CM Mr. Crofts 32 year old male returns for follow-up of his wife. He has a history of seizure disorder starting at the age 27. He is currently on Tegretol and Vimpat without side effects. He denies missing any doses of his medication. In the past he has had seizures when missing doses of his medication. He returns for reevaluation. 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   UPDATE 10/09/2017CM Mr. Wenzinger, 32 year old male returns for follow-up he has a history of seizure disorder. He is currently on Tegretol 200 mg 2 in the morning and 3 at night along with Vimpat 150 twice daily. He ran out of his Vimpat Saturday morning and had a seizure Sunday  Afternoon while watching football. He has not missed doses of his Tegretol. He returns for reevaluation. He states he wants to have meds increased however the important thing is take the medication as directed and do not run out of medication. He returns for reevaluation   HISTORY AA Seizures started at age 11. Complex partial and GTCs. [facility] neurology as a child. Dr Hyacinth Meeker did an MRI of the brain last when he was [age]. Last EEG when he was 4. Seizures started with staring. He has an aura, starts to feel cold on the inside of his body, he tries to talk a lot, he "passes out". Mother provides most information. He starts salivating, starts moving his tongue around, he can't answer the  simplest questions, he starts chewing a lot, staring off. Last GTCS was at age 52, denies missing dosages. He has been on tegretol all his life. Then they added Vimpat. Has had >3 events in the last month, woke up after urinating in the bed. Girlfriend said he had an episode of talking and saying strange things, afterwards  is tired and has to sleep. Not missing doses. Previous to this, he has been controlled ok, maybe one seizure a month. Recently there was an error in his medications, he was on 100mg  vimpat bid and was changed to 50mg  bid and maybe that increased the seizures however was still having them once a month previously.    Also having daily pressure type headaches. Worsening. No weakness. No focal neurologic symptoms. Takes OTC medications daily.    REVIEW OF SYSTEMS: Out of a complete 14 system review of symptoms, the patient complains only of the following symptoms, auras, seizure and all other reviewed systems are negative.  ALLERGIES: No Known Allergies  HOME MEDICATIONS: Outpatient Medications Prior to Visit  Medication Sig Dispense Refill   carbamazepine (TEGRETOL XR) 200 MG 12 hr tablet Take 3 tablets (600 mg total) by mouth 2 (two) times daily. 3 pills twice daily. 540 tablet 0   fexofenadine (ALLEGRA) 180 MG tablet Take 180 mg by mouth daily.     Fluticasone-Salmeterol (ADVAIR) 250-50 MCG/DOSE AEPB Inhale 1 puff into the lungs 2 (two) times daily.     lacosamide (VIMPAT) 200 MG TABS tablet TAKE 1 TABLET BY MOUTH TWICE A DAY 180 tablet 1   montelukast (SINGULAIR) 10 MG tablet Take 10 mg by mouth at bedtime.     XCOPRI 150 MG TABS TAKE 1 TABLET BY MOUTH EVERY DAY 30 tablet 5   No facility-administered medications prior to visit.    PAST MEDICAL HISTORY: Past Medical History:  Diagnosis Date   Seizure (HCC) 1993    PAST SURGICAL HISTORY: History reviewed. No pertinent surgical history.  FAMILY HISTORY: Family History  Problem Relation Age of Onset   Hypertension  Maternal Grandmother    Diabetes Maternal Grandfather     SOCIAL HISTORY: Social History   Socioeconomic History   Marital status: Married    Spouse name: Sue Lush   Number of children: 1   Years of education: Bachelor's   Highest education level: Not on file  Occupational History   Occupation: [employer]  Tobacco Use   Smoking status: Never   Smokeless tobacco: Never  Substance and Sexual Activity   Alcohol use: No    Alcohol/week: 0.0 standard drinks of alcohol   Drug use: No   Sexual activity: Not on file  Other Topics Concern   Not on file  Social History Narrative   Live at with home with wife, child.   Right handed.   Caffeine use: Drinks soda occassionally    Social Determinants of Health   Financial Resource Strain: Low Risk  (06/19/2023)   Received from [facility]   Overall Financial Resource Strain (CARDIA)    Difficulty of Paying Living Expenses: Not hard at all  Food Insecurity: No Food Insecurity (06/19/2023)   Received from [facility]   Hunger Vital Sign    Worried About Running Out of Food in the Last Year: Never true    Ran Out of Food in the Last Year: Never true  Transportation Needs: No Transportation Needs (06/19/2023)   Received from [facility] - Transportation    In the past 12 months, has lack of transportation kept you from medical appointments or from getting medications?: No    Lack of Transportation (Non-Medical): No  Physical Activity: Not on file  Stress: Not on file  Social Connections: Not on file  Intimate Partner Violence: Not on file  PHYSICAL EXAM  Vitals:   07/08/23 1552 07/08/23 1553  BP: (!) 142/95 (!) 135/90  Pulse: 75 80  Weight: 201 lb (91.2 kg)   Height: 6\' 1"  (1.854 m)      Body mass index is 26.52 kg/m.  Generalized: Well developed, in no acute distress  Cardiology: normal rate and rhythm, no murmur noted Neurological examination   Mentation: Alert oriented to time, place, history taking. Follows all commands speech and language fluent Cranial nerve II-XII: Pupils were equal round reactive to light. Extraocular movements were full, visual field were full on confrontational test. Facial sensation and strength were normal. Head turning and shoulder shrug  were normal and symmetric. Motor: The motor testing reveals 5 over 5 strength of all 4 extremities. Good symmetric motor tone is noted throughout.  Sensory: Sensory testing is intact to soft touch on all 4 extremities. No evidence of extinction is noted.  Coordination: Cerebellar testing reveals good finger-nose-finger and heel-to-shin bilaterally.  Gait and station: Gait is normal.    DIAGNOSTIC DATA (LABS, IMAGING, TESTING) - I reviewed patient records, labs, notes, testing and imaging myself where available.      No data to display           Lab Results  Component Value Date   WBC 3.3 (L) 09/03/2022   HGB 14.8 09/03/2022   HCT 42.2 09/03/2022   MCV 87 09/03/2022   PLT 237 09/03/2022      Component Value Date/Time   NA 142 09/03/2022 0839   K 4.6 09/03/2022 0839   CL 102 09/03/2022 0839   CO2 25 09/03/2022 0839   GLUCOSE 95 09/03/2022 0839   BUN 13 09/03/2022 0839   CREATININE 1.08 09/03/2022 0839   CALCIUM 9.8 09/03/2022 0839   PROT 8.0 09/03/2022 0839   ALBUMIN 4.8 09/03/2022 0839   AST 21 09/03/2022 0839   ALT 26 09/03/2022 0839   ALKPHOS 96 09/03/2022 0839   BILITOT <0.2 09/03/2022 0839   GFRNONAA 85 10/29/2020 1612   GFRAA 99 10/29/2020 1612   No results found for: "CHOL", "HDL", "LDLCALC", "LDLDIRECT", "TRIG", "CHOLHDL" No results found for: "HGBA1C" Lab Results  Component Value Date   VITAMINB12 397 12/20/2014   No results found for: "TSH"   Routine EEG 2016 normal  Routine EEG 2023 normal  Ambulatory EEG 2023 normal, no seizures no events  RECORDING DATES: 06/15/2023 - 06/18/2023 Technical Description: This EEG was acquired  using cable telemetry and a minimum of 16 EEG channels were used.   The patient and family were instructed to activate the push-button for any events. The EEG was reviewed for all events. The entire sleep period and randomly selected samples of the remainder of the tracing were reviewed. Video and audio were reviewed for push-button events and other periods of interest.   Interictal EEG: The occipital dominant rhythm was 8.5-9 Hz. This activity is reactive to stimulaiton. Present in the anterior head region is a 15-20 Hz beta activity. Drowsiness and sleep were manifested by background fragmentation, vertex waves, K-complexes, and sleep spindles. There was no focal slowing. There were no interictal epileptiform discharges. There were no electrographic seizures identified. Photic stimulation was not performed. Hyperventilation was not performed.   Events: Event #1 (02/19, 06/16/2023): Clinical: Patient pushes the button. Feels like cold, mild oral automatism, mild slowness in response, says yes to '' had aura '' when the RN ask him. Electrographic: First ictal changes characterized by rhythmic theta slowing over the left temporal region which then evolves  into sharply contoured theta over the left centro parietal and mid temporal region. Post-ictally, focal delta slowing is seen over the left hemisphere, most prominent over the left temporal region   Event 2: Clinical- Patient opens eyes, wakes up and push the button, looks around-->wide stare-->mild oral automatism-->raises up from bed --> head to Right --> forced head deviation to R -->Right hand clonic movements -->progressing to GTC-->Prominent post ictal fatigue. Electrographic --> Rhythmic theta noted in the left temporal region, which then evolves into rhythmic sharply contoured theta activity with intermixed spikes with rapid spread to the suprasylvian and infratemporal regions. This then evolves into lateralized rhythmic delta activity with  intermixed spikes and with subsequent reflection over the right hemisphere and then eventually progress into bilateral hemisphere diffusely.   Summary of LTM: During the course of this hospitalization, the interictal EEG showed: a normal background. There were two event so far consisting of focal seizure characterized by feeling aura (cold sensation), subtle oral automatism, slow to respond? With EEG correlate of having rhythmic theta over left temporal region. The second event consist of starting with impaired awareness,versive right head deviation progressing to bilateral GTC with EEG correlate of left temporal lobe onset progressing to GTC with diffuse involvement bilaterally.  Conclusion/Plan of LTM:  We will plan to discharge the patient this morning since he is back to his baseline.  Conference: The patient will need to be discussed at epilepsy conference.     MRI Brain 05/31/2022 Subtle FLAIR hyperintensity within the hippocampus on the left.  This was also observed on the MRI from 01/04/2015 and is a little less apparent on today's study.  Hippocampal volume appears symmetric.  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 No acute findings.  Normal enhancement pattern.   ASSESSMENT AND PLAN 32 y.o. year old male  has a past medical history of Seizure (HCC) (1993). here with     ICD-10-CM   1. Partial symptomatic epilepsy with complex partial seizures, not intractable, without status epilepticus (HCC)  G40.209       Since the addition of Xcopri he has not had any additional seizures. Currently he is Xcopri 150 mg daily, Vimpat 200 mg twice daily, and Tegretol XR 600 mg twice daily.  He has completed his EMU admission and is a good surgical candidate.  He is planned for surgery hopefully early next week.  Plan for now is to continue current medications.  I will schedule him for follow-up in 1 year but if and when he has his surgery done, I will see him 6 to 8  weeks post surgery.  He voiced understanding.     Patient Instructions  Continue current medications including Xcopri 150 mg daily Vimpat 200 mg twice daily and Tegretol 600 mg twice daily.   Please do contact me if you do have your surgery date Follow-up in 1 year or sooner if worse.    No orders of the defined types were placed in this encounter.    No orders of the defined types were placed in this encounter.    Windell Norfolk, MD 07/08/2023, 4:56 PM [facility] [address] [place], [place] [address] [phone]

## 2023-09-15 ENCOUNTER — Other Ambulatory Visit: Payer: Self-pay | Admitting: Neurology

## 2023-09-15 DIAGNOSIS — G40209 Localization-related (focal) (partial) symptomatic epilepsy and epileptic syndromes with complex partial seizures, not intractable, without status epilepticus: Secondary | ICD-10-CM

## 2023-09-28 ENCOUNTER — Other Ambulatory Visit: Payer: Self-pay | Admitting: Neurology

## 2023-09-29 NOTE — Telephone Encounter (Signed)
 Requested Prescriptions   Pending Prescriptions Disp Refills   XCOPRI  150 MG TABS [Pharmacy Med Name: XCOPRI  150 MG TABLET] 30 tablet     Sig: TAKE 1 TABLET BY MOUTH EVERY DAY   Last seen 07/08/23, next appt 07/11/24 Dispenses    Dispensed Days Supply Quantity Provider Pharmacy  XCOPRI  150 MG TABLET 08/25/2023 30 30 each Camara, Amadou, MD [facility] [phone] - G...  XCOPRI  150 MG TABLET 07/22/2023 30 30 each Camara, Amadou, MD [facility] [phone] - G...  XCOPRI  150 MG TABLET 06/23/2023 30 30 each Camara, Amadou, MD [facility] [phone] - G...  XCOPRI  150 MG TABLET 05/12/2023 30 30 each Camara, Amadou, MD [facility] [phone] - G...  XCOPRI  150 MG TABLET 04/13/2023 30 30 each Camara, Amadou, MD [facility] [phone] - G...  XCOPRI  150 MG TABLET 03/03/2023 30 30 each Camara, Amadou, MD [facility] [phone] - G...  XCOPRI  150 MG TABLET 02/02/2023 30 30 each Camara, Amadou, MD [facility] [phone] - G...  XCOPRI  150 MG TABLET 12/31/2022 30 30 each Gregg Lek, MD [facility] [phone] - G...  XCOPRI  150 MG TABLET 12/01/2022 30 30 each Camara, Amadou, MD [facility] [phone] - G...  XCOPRI  150 MG TABLET 11/03/2022 30 30 each Camara, Amadou, MD [facility] [phone] - G.SABRASABRA

## 2023-11-15 ENCOUNTER — Other Ambulatory Visit: Payer: Self-pay | Admitting: Neurology

## 2023-11-15 DIAGNOSIS — G40209 Localization-related (focal) (partial) symptomatic epilepsy and epileptic syndromes with complex partial seizures, not intractable, without status epilepticus: Secondary | ICD-10-CM

## 2023-12-25 ENCOUNTER — Other Ambulatory Visit: Payer: Self-pay | Admitting: Neurology

## 2023-12-25 DIAGNOSIS — G40209 Localization-related (focal) (partial) symptomatic epilepsy and epileptic syndromes with complex partial seizures, not intractable, without status epilepticus: Secondary | ICD-10-CM

## 2024-01-07 DIAGNOSIS — Z0289 Encounter for other administrative examinations: Secondary | ICD-10-CM

## 2024-01-12 ENCOUNTER — Other Ambulatory Visit: Payer: Self-pay | Admitting: Neurology

## 2024-01-12 MED ORDER — CENOBAMATE 200 MG PO TABS: 200.0000 mg | ORAL_TABLET | Freq: Every day | ORAL | 5 refills | Status: AC

## 2024-01-12 NOTE — Telephone Encounter (Signed)
 Yes, intermittent. Please advise patient to increase the Cenobamate  to 200 mg daily since he is still having aura and seizure. I will send the new prescription today.

## 2024-01-14 ENCOUNTER — Telehealth: Payer: Self-pay | Admitting: *Deleted

## 2024-01-14 NOTE — Telephone Encounter (Signed)
 Pt [employer] form faxed on 01/14/2024 [phone]

## 2024-02-05 ENCOUNTER — Encounter (HOSPITAL_BASED_OUTPATIENT_CLINIC_OR_DEPARTMENT_OTHER): Payer: Self-pay | Admitting: *Deleted

## 2024-02-05 ENCOUNTER — Emergency Department (HOSPITAL_BASED_OUTPATIENT_CLINIC_OR_DEPARTMENT_OTHER)
Admission: EM | Admit: 2024-02-05 | Discharge: 2024-02-05 | Disposition: A | Discharge status: Home / Self Care | Attending: Emergency Medicine | Admitting: Emergency Medicine

## 2024-02-05 ENCOUNTER — Other Ambulatory Visit: Payer: Self-pay

## 2024-02-05 DIAGNOSIS — R58 Hemorrhage, not elsewhere classified: Secondary | ICD-10-CM

## 2024-02-05 DIAGNOSIS — Z4801 Encounter for change or removal of surgical wound dressing: Secondary | ICD-10-CM | POA: Insufficient documentation

## 2024-02-05 DIAGNOSIS — L7622 Postprocedural hemorrhage and hematoma of skin and subcutaneous tissue following other procedure: Secondary | ICD-10-CM | POA: Insufficient documentation

## 2024-02-05 DIAGNOSIS — Z5189 Encounter for other specified aftercare: Secondary | ICD-10-CM

## 2024-02-05 LAB — CBC WITH DIFFERENTIAL/PLATELET
Abs Immature Granulocytes: 0.02 10*3/uL (ref 0.00–0.07)
Basophils Absolute: 0 10*3/uL (ref 0.0–0.1)
Basophils Relative: 0 %
Eosinophils Absolute: 0.1 10*3/uL (ref 0.0–0.5)
Eosinophils Relative: 1 %
HCT: 34.2 % — ABNORMAL LOW (ref 39.0–52.0)
Hemoglobin: 11.8 g/dL — ABNORMAL LOW (ref 13.0–17.0)
Immature Granulocytes: 0 %
Lymphocytes Relative: 41 %
Lymphs Abs: 2.8 10*3/uL (ref 0.7–4.0)
MCH: 30.1 pg (ref 26.0–34.0)
MCHC: 34.5 g/dL (ref 30.0–36.0)
MCV: 87.2 fL (ref 80.0–100.0)
Monocytes Absolute: 0.5 10*3/uL (ref 0.1–1.0)
Monocytes Relative: 7 %
Neutro Abs: 3.4 10*3/uL (ref 1.7–7.7)
Neutrophils Relative %: 51 %
Platelets: 226 10*3/uL (ref 150–400)
RBC: 3.92 MIL/uL — ABNORMAL LOW (ref 4.22–5.81)
RDW: 12.4 % (ref 11.5–15.5)
WBC: 6.8 10*3/uL (ref 4.0–10.5)
nRBC: 0 % (ref 0.0–0.2)

## 2024-02-05 NOTE — ED Notes (Signed)
 Cleaned surgical wound and gently applied gauze pressure dressing to affected area per pt comfort.

## 2024-02-05 NOTE — Discharge Instructions (Signed)
 Leave dressing on for the next 24 hours. Return if any further bleeding problems.

## 2024-02-05 NOTE — ED Triage Notes (Signed)
 [facility] home[facility]. D/c'd this am. Stapled head incision appears approximated well. Lower portion of incision has bloody drainage. No s/sx  of hematoma or infection. Blood is dry, scabbed and minimal. Mentions HA. Denies other sx. HA rated at 8-9/10, but here for drainage.

## 2024-02-06 NOTE — ED Provider Notes (Signed)
 [place] [facility] AT [facility] Provider Note   CSN: [id number] Arrival date & time: 02/05/24  1757     History  Chief Complaint  Patient presents with   Post-op Problem    Robert Byrd is a 33 y.o. male.  Patient reports he had a craniotomy performed at [facility] on 520.  He has multiple staples to the left side of his scalp.  He reports that tonight he had bleeding from the lower part of stapled incision.  Patient's neurosurgeon advised patient to come to the emergency department for evaluation.  He has a dried area of blood but is not actively bleeding.  He denies any neurologic complaints.  Patient is here with spouse who is supportive        Home Medications Prior to Admission medications   Medication Sig Start Date End Date Taking? Authorizing Provider  carbamazepine  (TEGRETOL  XR) 200 MG 12 hr tablet TAKE 3 TABLETS (600 MG TOTAL) BY MOUTH 2 (TWO) TIMES DAILY. 3 PILLS TWICE DAILY. 12/25/23   Camara, Amadou, MD  Cenobamate  200 MG TABS Take 200 mg by mouth daily at 12 noon. 01/12/24 02/11/24  Camara, Amadou, MD  fexofenadine (ALLEGRA) 180 MG tablet Take 180 mg by mouth daily. 03/03/22   [provider]  Fluticasone-Salmeterol (ADVAIR) 250-50 MCG/DOSE AEPB Inhale 1 puff into the lungs 2 (two) times daily.    [provider]  lacosamide  (VIMPAT ) 200 MG TABS tablet TAKE 1 TABLET BY MOUTH TWICE A DAY 11/16/23   Camara, Amadou, MD  montelukast (SINGULAIR) 10 MG tablet Take 10 mg by mouth at bedtime.    [provider]      Allergies    Patient has no known allergies.    Review of Systems   Review of Systems  All other systems reviewed and are negative.   Physical Exam Updated Vital Signs BP (!) 132/99 (BP Location: Left Arm)   Pulse 82   Temp 98.4 F (36.9 C) (Oral)   Resp 16   Wt 90.7 kg   SpO2 100%   BMI 26.39 kg/m  Physical Exam Vitals reviewed.  Constitutional:      Appearance: Normal appearance.  HENT:     Head:  Normocephalic.     Comments: Incision left scalp, multiple staples, dried blood lower aspect of incision, no active bleeding. Cardiovascular:     Rate and Rhythm: Normal rate.  Pulmonary:     Effort: Pulmonary effort is normal.  Musculoskeletal:        General: Normal range of motion.  Skin:    General: Skin is warm.  Neurological:     General: No focal deficit present.     Mental Status: He is alert.     ED Results / Procedures / Treatments   Labs (all labs ordered are listed, but only abnormal results are displayed) Labs Reviewed  CBC WITH DIFFERENTIAL/PLATELET - Abnormal; Notable for the following components:      Result Value   RBC 3.92 (*)    Hemoglobin 11.8 (*)    HCT 34.2 (*)    All other components within normal limits    EKG None  Radiology No results found.  Procedures Procedures    Medications Ordered in ED Medications - No data to display  ED Course/ Medical Decision Making/ A&P  Medical Decision Making Patient here for evaluation of bleeding from surgical site after having a craniotomy on 520.  Patient had surgery at [facility]  Risk Risk Details: Surgical incision looks good.  There is a small amount of dried blood at the lower aspect of wound.  No active bleeding.  Dressing applied to area.  I advised return to return if any further bleeding.  He is advised if he has any neurologic complications fever or chills he should proceed to [facility] where he had his surgery for evaluation.           Final Clinical Impression(s) / ED Diagnoses Final diagnoses:  Bleeding  Encounter for wound care    Rx / DC Orders ED Discharge Orders     None      An After Visit Summary was printed and given to the patient.    Sandi Crosby, PA-C 02/06/24 0031    Afton Horse T, DO 02/08/24 Alonso Jan

## 2024-03-07 ENCOUNTER — Ambulatory Visit: Attending: Neurosurgery | Admitting: Speech Pathology

## 2024-03-07 DIAGNOSIS — R41841 Cognitive communication deficit: Secondary | ICD-10-CM | POA: Diagnosis present

## 2024-03-07 DIAGNOSIS — R4701 Aphasia: Secondary | ICD-10-CM | POA: Insufficient documentation

## 2024-03-07 NOTE — Therapy (Signed)
 OUTPATIENT SPEECH LANGUAGE PATHOLOGY EVALUATION   Patient Name: Robert Byrd MRN: [id number] DOB:05-29-1991, 33 y.o., male Today's Date: 03/07/2024  PCP: Cleotilde Bernardino Hutchinson, PA-C REFERRING PROVIDER: Nettie Shasta Olam Beth, PA-C  END OF SESSION:  End of Session - 03/07/24 1211     Visit Number 1    Number of Visits 17    Date for SLP Re-Evaluation 05/07/24    SLP Start Time 0930    SLP Stop Time  1015    SLP Time Calculation (min) 45 min    Activity Tolerance Patient tolerated treatment well          Past Medical History:  Diagnosis Date   Seizure (HCC) 1993   No past surgical history on file. 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   Pt accompanied by: self  PERTINENT HISTORY: epilepsy, LATL surgery  PAIN:  Are you having pain? No  FALLS: Has patient fallen in last 6 months?  No  LIVING ENVIRONMENT: Lives with: lives with their family; Wife [name]) Kids, [name] and [place] Lives in: House/apartment  PLOF:  Level of assistance: Independent with ADLs, Independent with IADLs Employment: Full-time employment, On disability; Reports for [employer] (unsure of title)   PATIENT GOALS: language  OBJECTIVE:  Note: Objective measures were  completed at Evaluation unless otherwise noted.  DIAGNOSTIC FINDINGS: Limited on this EMR  COGNITION: Overall cognitive status: Impaired Areas of impairment:  Memory: Impaired: Short term Auditory Functional deficits: Needs to be further assessed in conjunction with language deficits.   AUDITORY COMPREHENSION: Overall auditory comprehension: Appears intact YES/NO questions: Appears intact Following directions: Appears intact Conversation: Complex Interfering components: working Research scientist (life sciences): extra processing time and repetition/stressing words  READING COMPREHENSION: Impaired: paragraph; reports slowed processing and taking longer to read multiple paragraphs. When assessed with single paragraph, pt scored 4/5 on comprehension questions.   EXPRESSION: verbal  VERBAL EXPRESSION: Level of generative/spontaneous verbalization: conversation Automatic speech: N/A Repetition: Appears intact @ sentence Naming: Confrontation: 51-75% and Divergent: impaired on CLQT Pragmatics: Appears intact Comments: NA Interfering components: NA Effective technique: sentence completion and descriptions Non-verbal means of communication: N/A  WRITTEN EXPRESSION: Dominant hand: right Written expression: Not tested  MOTOR SPEECH: Overall motor speech: Appears intact Level of impairment: Conversation Respiration: thoracic breathing Phonation: normal Resonance: WFL Articulation: Appears intact Intelligibility: Intelligible Motor planning: Appears intact Motor speech errors: NA Interfering components: NA Effective technique: NA  ORAL MOTOR EXAMINATION: Overall status: WFL Comments: reports reduced jaw ROM  STANDARDIZED ASSESSMENTS: Initiated CLQT - to complete next session  [employer] - Short Form: 6/15 (with pt reporting he did not actually know 2 items on this test)    PATIENT REPORTED OUTCOME MEASURES (PROM): To complete next session  TREATMENT DATE:    PATIENT EDUCATION: Education details: Aphasia, SLP role Person educated: Patient Education method: Explanation Education comprehension: needs further education   GOALS: Goals reviewed with patient? No  SHORT TERM GOALS: Target date: 04/06/24  Complete CLQT, PROMs, and update goals.  Baseline: Goal status: INITIAL  2.   Baseline:  Goal status: INITIAL  3.   Baseline:  Goal status: INITIAL  4.   Baseline:  Goal status: INITIAL  5.   Baseline:  Goal status: INITIAL  6.   Baseline:  Goal status: INITIAL  LONG TERM GOALS: Target date: 05/07/24  Improve score on PROMs Baseline:  Goal status: INITIAL  2.   Baseline:  Goal status: INITIAL  3.   Baseline:  Goal status: INITIAL  4.   Baseline:  Goal status: INITIAL  5.   Baseline:  Goal status: INITIAL  6.   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 Pt endorses language changes re: word finding,auditory/written comprehension, and paraphasias. He also reports needing several reminders throughout the day to complete tasks. Pt was assessed using CLQT, portions of WAB-B, and BNT. To complete next session. 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 Pt demonstrated relative strengths in attention, following directions, and answering yes/no questions and relative weaknesses in story recall and word finding. SLP rec skilled ST services to address aphasia and cognitive-communication impairment.    OBJECTIVE IMPAIRMENTS: include memory and aphasia. These impairments are limiting patient from return to work, managing medications, managing appointments, managing finances, household responsibilities, and effectively  communicating at home and in community. Factors affecting potential to achieve goals and functional outcome are NA. Patient will benefit from skilled SLP services to address above impairments and improve overall function.  REHAB POTENTIAL: Good  PLAN:  SLP FREQUENCY: 2x/week  SLP DURATION: 8 weeks  PLANNED INTERVENTIONS: Environmental controls, Cueing hierachy, Internal/external aids, Functional tasks, Multimodal communication approach, SLP instruction and feedback, Compensatory strategies, Patient/family education, and [id number] Treatment of speech (30 or 45 min)     [employer], CCC-SLP 03/07/2024, 12:12 PM

## 2024-03-08 ENCOUNTER — Ambulatory Visit: Admitting: Speech Pathology

## 2024-03-08 ENCOUNTER — Encounter: Payer: Self-pay | Admitting: Speech Pathology

## 2024-03-08 DIAGNOSIS — R4701 Aphasia: Secondary | ICD-10-CM | POA: Diagnosis not present

## 2024-03-08 DIAGNOSIS — R41841 Cognitive communication deficit: Secondary | ICD-10-CM

## 2024-03-08 NOTE — Therapy (Signed)
 OUTPATIENT SPEECH LANGUAGE PATHOLOGY TREATMENT   Patient Name: Robert Byrd MRN: [id number] DOB:May 12, 1991, 33 y.o., male Today's Date: 03/08/2024  PCP: Cleotilde Bernardino Hutchinson, PA-C REFERRING PROVIDER: Nettie Shasta Olam Beth, PA-C  END OF SESSION:  End of Session - 03/08/24 0848     Visit Number 2    Number of Visits 17    Date for SLP Re-Evaluation 05/07/24    SLP Start Time 0846    SLP Stop Time  0925    SLP Time Calculation (min) 39 min    Activity Tolerance Patient tolerated treatment well          Past Medical History:  Diagnosis Date   Seizure (HCC) 1993   History reviewed. No pertinent surgical history. 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 SLP does not feel pt's sx are severe enough to warrant x3/week treatment. To stay on this POC unless there are other changes warranting increase in frequency.  Pt accompanied by: self  PERTINENT HISTORY: epilepsy, LATL surgery  PAIN:  Are you having pain? Yes: NPRS scale: 4 Pain location: L side (surgical site) Pain description: squeezing Aggravating factors: laying down causes swelling Relieving factors: Meds   FALLS: Has patient fallen in last 6 months?  No  LIVING ENVIRONMENT: Lives with: lives with  their family; Wife [name]) Kids, [name] and [place] Lives in: House/apartment  PLOF:  Level of assistance: Independent with ADLs, Independent with IADLs Employment: Full-time employment, On disability; Reports for [employer] (unsure of title)   PATIENT GOALS: language  OBJECTIVE:  Note: Objective measures were completed at Evaluation unless otherwise noted.  DIAGNOSTIC FINDINGS: Limited on this EMR  COGNITION: Overall cognitive status: Impaired Areas of impairment:  Memory: Impaired: Short term Auditory Functional deficits: Needs to be further assessed in conjunction with language deficits.   AUDITORY COMPREHENSION: Overall auditory comprehension: Appears intact YES/NO questions: Appears intact Following directions: Appears intact Conversation: Complex Interfering components: working Research scientist (life sciences): extra processing time and repetition/stressing words  READING COMPREHENSION: Impaired: paragraph; reports slowed processing and taking longer to read multiple paragraphs. When assessed with single paragraph, pt scored 4/5 on comprehension questions.   EXPRESSION: verbal  VERBAL EXPRESSION: Level of generative/spontaneous verbalization: conversation Automatic speech: N/A Repetition: Appears intact @ sentence Naming: Confrontation: 51-75% and Divergent: impaired on CLQT Pragmatics: Appears intact Comments: NA Interfering components: NA Effective technique: sentence completion and descriptions Non-verbal means of communication: N/A  WRITTEN EXPRESSION: Dominant hand: right Written expression: Not tested  MOTOR SPEECH: Overall motor speech: Appears intact Level of impairment: Conversation Respiration: thoracic breathing Phonation: normal Resonance: WFL Articulation: Appears intact Intelligibility: Intelligible Motor planning: Appears intact Motor speech errors: NA Interfering components: NA Effective technique: NA  ORAL MOTOR EXAMINATION: Overall status:  WFL Comments: reports reduced jaw ROM  STANDARDIZED ASSESSMENTS: Initiated CLQT - to complete next session  [employer] - Short Form: 6/15 (with pt reporting  he did not actually know 2 items on this test)    PATIENT REPORTED OUTCOME MEASURES (PROM): Communication Participation: 16/30; lower number indicating greater impact on QOL                                                                                                                            TREATMENT DATE:    03/08/24: Pt was seen for skilled ST services targeting continued assessment. SLP reviewed results of CLQT. Pt had most difficulty in sections requiring language re: story recall, divergent naming (concrete and abstract). Cognitive sections were WFL. Pt reports he has no difficulty understanding verbal information, but does struggle to recall verbal information which is consistent with where surgical site is located.     PATIENT EDUCATION: Education details: Aphasia, SLP role Person educated: Patient Education method: Explanation Education comprehension: needs further education   GOALS: Goals reviewed with patient? No  SHORT TERM GOALS: Target date: 04/06/24  Complete CLQT, PROMs, and update goals.  Baseline: Goal status: MET  2.  Pt will recall 3 word finding strategies to use in instances of anomia Baseline:  Goal status: INITIAL  3.  Pt will recall 2+ reading strategies to support comprehension of complex, written information Baseline:  Goal status: INITIAL  4.  Pt will recall 2 strategies to support active listening skills in conversations  Baseline:  Goal status: INITIAL    LONG TERM GOALS: Target date: 05/07/24  Improve score on PROMs Baseline:  Goal status: INITIAL  2.  Pt/wife will report successful use of supported communication strategies for aphasia.  Baseline:  Goal status: INITIAL  3.  Pt will report successful use of active listening skills in conversations Baseline:  Goal  status: INITIAL  4.  Pt will report successful use of memory strategies in recall of important information.  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 See tx note. SLP rec skilled ST services to address aphasia and cognitive-communication impairment.    OBJECTIVE IMPAIRMENTS: include memory and aphasia. These impairments are limiting patient from return to work, managing medications, managing appointments, managing finances, household responsibilities, and effectively communicating at home and in community. Factors affecting potential to achieve goals and functional outcome are NA. Patient will benefit from skilled SLP services to address above impairments and improve overall function.  REHAB POTENTIAL: Good  PLAN:  SLP FREQUENCY: 2x/week  SLP DURATION: 8 weeks  PLANNED INTERVENTIONS: Environmental controls, Cueing hierachy, Internal/external aids, Functional tasks, Multimodal communication approach, SLP instruction and feedback, Compensatory strategies, Patient/family education, and [id number] Treatment of speech (30 or 45 min)     [employer], CCC-SLP 03/08/2024, 8:48 AM

## 2024-03-14 ENCOUNTER — Ambulatory Visit: Admitting: Speech Pathology

## 2024-03-14 ENCOUNTER — Encounter: Payer: Self-pay | Admitting: Speech Pathology

## 2024-03-14 DIAGNOSIS — R4701 Aphasia: Secondary | ICD-10-CM

## 2024-03-14 NOTE — Therapy (Unsigned)
 OUTPATIENT SPEECH LANGUAGE PATHOLOGY TREATMENT   Patient Name: Robert Byrd MRN: [id number] DOB:Nov 30, 1990, 33 y.o., male Today's Date: 03/14/2024  PCP: Cleotilde Bernardino Hutchinson, PA-C REFERRING PROVIDER: Nettie Shasta Olam Beth, PA-C  END OF SESSION:  End of Session - 03/14/24 1104     Visit Number 3    Number of Visits 17    Date for SLP Re-Evaluation 05/07/24    SLP Start Time 1102    SLP Stop Time  1142    SLP Time Calculation (min) 40 min    Activity Tolerance Patient tolerated treatment well          Past Medical History:  Diagnosis Date   Seizure (HCC) 1993   History reviewed. No pertinent surgical history. 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  Pt accompanied by: self  PERTINENT HISTORY: epilepsy, LATL surgery  PAIN:  Are you having pain? Yes: NPRS scale: 4 Pain location: L side (surgical site) Pain description: squeezing Aggravating factors: laying down causes swelling Relieving factors: Meds   FALLS: Has patient fallen in last 6 months?  No  LIVING ENVIRONMENT: Lives with: lives with their family; Wife [name]) Kids, [name] and [place] Lives in: House/apartment  PLOF:  Level of assistance: Independent with ADLs, Independent with IADLs Employment: Full-time employment,  On disability; Reports for [employer] (unsure of title)   PATIENT GOALS: language  OBJECTIVE:  Note: Objective measures were completed at Evaluation unless otherwise noted.  DIAGNOSTIC FINDINGS: Limited on this EMR  COGNITION: Overall cognitive status: Impaired Areas of impairment:  Memory: Impaired: Short term Auditory Functional deficits: Needs to be further assessed in conjunction with language deficits.   AUDITORY COMPREHENSION: Overall auditory comprehension: Appears intact YES/NO questions: Appears intact Following directions: Appears intact Conversation: Complex Interfering components: working Research scientist (life sciences): extra processing time and repetition/stressing words  READING COMPREHENSION: Impaired: paragraph; reports slowed processing and taking longer to read multiple paragraphs. When assessed with single paragraph, pt scored 4/5 on comprehension questions.   EXPRESSION: verbal  VERBAL EXPRESSION: Level of generative/spontaneous verbalization: conversation Automatic speech: N/A Repetition: Appears intact @ sentence Naming: Confrontation: 51-75% and Divergent: impaired on CLQT Pragmatics: Appears intact Comments: NA Interfering components: NA Effective technique: sentence completion and descriptions Non-verbal means of communication: N/A  WRITTEN EXPRESSION: Dominant hand: right Written expression: Not tested  MOTOR SPEECH: Overall motor speech: Appears intact Level of impairment: Conversation Respiration: thoracic breathing Phonation: normal Resonance: WFL Articulation: Appears intact Intelligibility: Intelligible Motor planning: Appears intact Motor speech errors: NA Interfering components: NA Effective technique: NA  ORAL MOTOR EXAMINATION: Overall status: WFL Comments: reports reduced jaw ROM  STANDARDIZED ASSESSMENTS: Initiated CLQT - to complete next session  [employer] - Short Form: 6/15 (with pt reporting he did not actually  know 2 items on this test)    PATIENT REPORTED OUTCOME MEASURES (PROM): Communication Participation: 16/30; lower number indicating greater impact on QOL  TREATMENT DATE:   03/14/24: Pt was seen for skilled ST services targeting aphasia and communication strategy education. Pt was accompanied by wife and niece who report communication difficulties at home. Slp reviewed term aphasia with family using whiteboard to describe expressive vs receptive language. Then, SLP educated on communication strategies (I.e. minimizing distractions, asking for clarification, giving extra time for processing, direct communication, manage fatigue etc.), provided relevant examples, and provided handouts. Wife reports they struggle at home with re: his attention, easily becoming overwhelmed with noise, processing verbal information, and him expressing thoughts. Pt was in agreement with wife insights. To cont with word finding strategies next session.   03/08/24: Pt was seen for skilled ST services targeting continued assessment. SLP reviewed results of CLQT. Pt had most difficulty in sections requiring language re: story recall, divergent naming (concrete and abstract). Cognitive sections were WFL. Pt reports he has no difficulty understanding verbal information, but does struggle to recall verbal information which is consistent with where surgical site is located.     PATIENT EDUCATION: Education details: Aphasia, SLP role Person educated: Patient Education method: Explanation Education comprehension: needs further education   GOALS: Goals reviewed with patient? No  SHORT TERM GOALS: Target date: 04/06/24  Complete CLQT, PROMs, and update goals.  Baseline: Goal status: MET  2.  Pt will recall 3 word finding strategies to use in instances of anomia Baseline:  Goal status: INITIAL  3.   Pt will recall 2+ reading strategies to support comprehension of complex, written information Baseline:  Goal status: INITIAL  4.  Pt will recall 2 strategies to support active listening skills in conversations  Baseline:  Goal status: INITIAL    LONG TERM GOALS: Target date: 05/07/24  Improve score on PROMs Baseline:  Goal status: INITIAL  2.  Pt/wife will report successful use of supported communication strategies for aphasia.  Baseline:  Goal status: INITIAL  3.  Pt will report successful use of active listening skills in conversations Baseline:  Goal status: INITIAL  4.  Pt will report successful use of memory strategies in recall of important information.  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 See tx note. SLP rec skilled ST services to address aphasia and cognitive-communication impairment.    OBJECTIVE IMPAIRMENTS: include memory and aphasia. These impairments are limiting patient from return to work, managing medications, managing appointments, managing finances, household responsibilities, and effectively communicating at home and in community. Factors affecting potential to achieve goals and functional outcome are NA. Patient will benefit from skilled SLP services to address above impairments and improve overall function.  REHAB POTENTIAL: Good  PLAN:  SLP FREQUENCY: 2x/week  SLP DURATION: 8 weeks  PLANNED INTERVENTIONS: Environmental controls, Cueing hierachy, Internal/external aids, Functional tasks, Multimodal communication approach, SLP instruction and feedback, Compensatory strategies, Patient/family education, and [id number] Treatment of speech (30 or 45 min)     [employer], CCC-SLP 03/14/2024, 11:04 AM

## 2024-03-17 ENCOUNTER — Encounter: Payer: Self-pay | Admitting: Speech Pathology

## 2024-03-17 ENCOUNTER — Ambulatory Visit: Attending: Neurosurgery | Admitting: Speech Pathology

## 2024-03-17 ENCOUNTER — Telehealth: Payer: Self-pay | Admitting: Neurology

## 2024-03-17 DIAGNOSIS — R41841 Cognitive communication deficit: Secondary | ICD-10-CM | POA: Diagnosis present

## 2024-03-17 DIAGNOSIS — R4701 Aphasia: Secondary | ICD-10-CM | POA: Insufficient documentation

## 2024-03-17 NOTE — Telephone Encounter (Signed)
 Call to patient, he reports headaches since temporal lobectomy in May. He reports standing up makes the headaches better.   Verbal discussion with Dr. Gregg and he advised to follow up with neurosurgery and neurology at [facility] since headaches have been consistent after surgery. Dr. Gregg agreed to send in Gabapentin as needed with limited supply until can get an appointment with [facility].   Discussed recommendations with patient and he verbalized understanding. Reviewed common side effects of gabapentin.

## 2024-03-17 NOTE — Therapy (Signed)
 OUTPATIENT SPEECH LANGUAGE PATHOLOGY TREATMENT   Patient Name: Robert Byrd MRN: [id number] DOB:[birth year], 33 y.o., male Today's Date: 03/17/2024  PCP: Cleotilde Bernardino Hutchinson, PA-C REFERRING PROVIDER: Nettie Shasta Olam Beth, PA-C  END OF SESSION:  End of Session - 03/17/24 1103     Visit Number 4    Number of Visits 17    Date for SLP Re-Evaluation 05/07/24    SLP Start Time 1100    SLP Stop Time  1140    SLP Time Calculation (min) 40 min    Activity Tolerance Patient tolerated treatment well          Past Medical History:  Diagnosis Date   Seizure (HCC) 1993   History reviewed. No pertinent surgical history. 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 No   FALLS: Has patient fallen in last 6 months?  No  LIVING ENVIRONMENT: Lives with: lives with their family; Wife [name]) Kids, [name] and [place] Lives in: House/apartment  PLOF:  Level of assistance: Independent with ADLs, Independent with IADLs Employment: Full-time employment, On disability; Reports for [employer] (unsure of title)   PATIENT GOALS: language  OBJECTIVE:   Note: Objective measures were completed at Evaluation unless otherwise noted.  DIAGNOSTIC FINDINGS: Limited on this EMR  COGNITION: Overall cognitive status: Impaired Areas of impairment:  Memory: Impaired: Short term Auditory Functional deficits: Needs to be further assessed in conjunction with language deficits.   AUDITORY COMPREHENSION: Overall auditory comprehension: Appears intact YES/NO questions: Appears intact Following directions: Appears intact Conversation: Complex Interfering components: working Research scientist (life sciences): extra processing time and repetition/stressing words  READING COMPREHENSION: Impaired: paragraph; reports slowed processing and taking longer to read multiple paragraphs. When assessed with single paragraph, pt scored 4/5 on comprehension questions.   EXPRESSION: verbal  VERBAL EXPRESSION: Level of generative/spontaneous verbalization: conversation Automatic speech: N/A Repetition: Appears intact @ sentence Naming: Confrontation: 51-75% and Divergent: impaired on CLQT Pragmatics: Appears intact Comments: NA Interfering components: NA Effective technique: sentence completion and descriptions Non-verbal means of communication: N/A  WRITTEN EXPRESSION: Dominant hand: right Written expression: Not tested  MOTOR SPEECH: Overall motor speech: Appears intact Level of impairment: Conversation Respiration: thoracic breathing Phonation: normal Resonance: WFL Articulation: Appears intact Intelligibility: Intelligible Motor planning: Appears intact Motor speech errors: NA Interfering components: NA Effective technique: NA  ORAL MOTOR EXAMINATION: Overall status: WFL Comments: reports reduced jaw ROM  STANDARDIZED ASSESSMENTS: Initiated CLQT - to complete next session  [employer] - Short Form: 6/15 (with pt reporting he did not actually know 2 items on this test)    PATIENT REPORTED OUTCOME MEASURES (PROM): Communication  Participation: 16/30; lower number indicating greater impact on QOL  TREATMENT DATE:   03/17/24: Pt was seen for skilled ST services targeting aphasia. Pt reports  increased understanding and sympathy from wife in regards to communication after our therapy appointment.  Pt reports he is supposed to return to work on July 22nd, but does not feel ready. SLP encouraged pt to reach out to neurologist for further guidance. Based off deficits, SLP in agreement with postponing return to work due to aphasia and other cognitive deficits re: memory and attention. SLP educated on word finding strategies. Pt reported he would be most likely to re: delay, synonym, look it up, gesture. To continue with strategy practice nxt session.   03/14/24: Pt was seen for skilled ST services targeting aphasia and communication strategy education. Pt was accompanied by wife and niece who report communication difficulties at home. Slp reviewed term aphasia with family using whiteboard to describe expressive vs receptive language. Then, SLP educated on communication strategies (I.e. minimizing distractions, asking for clarification, giving extra time for processing, direct communication, manage fatigue etc.), provided relevant examples, and provided handouts. Wife reports they struggle at home with re: his attention, easily becoming overwhelmed with noise, processing verbal information, and him expressing thoughts. Pt was in agreement with wife insights. To cont with word finding strategies next session.   03/08/24: Pt was seen for skilled ST services targeting continued assessment. SLP reviewed results of CLQT. Pt had most difficulty in sections requiring language re: story recall, divergent naming (concrete and abstract). Cognitive sections were WFL. Pt reports he has no difficulty understanding verbal information,  but does struggle to recall verbal information which is consistent with where surgical site is located.     PATIENT EDUCATION: Education details: Aphasia, SLP role Person educated: Patient Education method: Explanation Education comprehension: needs further education   GOALS: Goals reviewed with patient? No  SHORT TERM GOALS: Target date: 04/06/24  Complete CLQT, PROMs, and update goals.  Baseline: Goal status: MET  2.  Pt will recall 3 word finding strategies to use in instances of anomia Baseline:  Goal status: INITIAL  3.  Pt will recall 2+ reading strategies to support comprehension of complex, written information Baseline:  Goal status: INITIAL  4.  Pt will recall 2 strategies to support active listening skills in conversations  Baseline:  Goal status: INITIAL    LONG TERM GOALS: Target date: 05/07/24  Improve score on PROMs Baseline:  Goal status: INITIAL  2.  Pt/wife will report successful use of supported communication strategies for aphasia.  Baseline:  Goal status: INITIAL  3.  Pt will report successful use of active listening skills in conversations Baseline:  Goal status: INITIAL  4.  Pt will report successful use of memory strategies in recall of important information.  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 See tx note. SLP rec skilled ST services to address aphasia and cognitive-communication impairment.    OBJECTIVE IMPAIRMENTS: include memory and aphasia. These impairments are limiting patient from return to work, managing medications, managing appointments, managing finances, household responsibilities, and effectively communicating at home and in community. Factors affecting potential to achieve goals and functional outcome are NA. Patient will benefit from skilled SLP services to address above impairments and improve overall  function.  REHAB POTENTIAL: Good  PLAN:  SLP FREQUENCY: 2x/week  SLP DURATION: 8 weeks  PLANNED INTERVENTIONS: Environmental controls, Cueing hierachy, Internal/external aids, Functional tasks,  Multimodal communication approach, SLP instruction and feedback, Compensatory strategies, Patient/family education, and [id number] Treatment of speech (30 or 45 min)     [employer], CCC-SLP 03/17/2024, 11:03 AM

## 2024-03-17 NOTE — Telephone Encounter (Signed)
 Pt called to request earlier appt. Pt states that he had brian surgery a month and half ago and the pain in his head is unbearable .Pt states that no matter  what he takes the headaches has continued to hurt. Pt has been taking his normal prescribe medication and have been  told to take Tylenol  or Advil  if worsen . Pt states that not working either . PT request to be seen earlier then appt  he has .

## 2024-03-21 MED ORDER — GABAPENTIN 300 MG PO CAPS: 300.0000 mg | ORAL_CAPSULE | Freq: Three times a day (TID) | ORAL | 0 refills | Status: DC | PRN

## 2024-03-21 NOTE — Telephone Encounter (Signed)
 Done. Thanks.

## 2024-03-22 ENCOUNTER — Other Ambulatory Visit: Payer: Self-pay | Admitting: Neurology

## 2024-03-22 ENCOUNTER — Encounter: Payer: Self-pay | Admitting: Speech Pathology

## 2024-03-22 ENCOUNTER — Ambulatory Visit: Admitting: Speech Pathology

## 2024-03-22 DIAGNOSIS — G40209 Localization-related (focal) (partial) symptomatic epilepsy and epileptic syndromes with complex partial seizures, not intractable, without status epilepticus: Secondary | ICD-10-CM

## 2024-03-22 DIAGNOSIS — R4701 Aphasia: Secondary | ICD-10-CM | POA: Diagnosis not present

## 2024-03-22 NOTE — Therapy (Unsigned)
 OUTPATIENT SPEECH LANGUAGE PATHOLOGY TREATMENT   Patient Name: Robert Byrd MRN: [id number] DOB:02/07/1991, 33 y.o., male Today's Date: 03/22/2024  PCP: Cleotilde Bernardino Hutchinson, PA-C REFERRING PROVIDER: Nettie Shasta Olam Beth, PA-C  END OF SESSION:  End of Session - 03/22/24 0849     Visit Number 5    Number of Visits 17    Date for SLP Re-Evaluation 05/07/24    SLP Start Time 0845    SLP Stop Time  0925    SLP Time Calculation (min) 40 min    Activity Tolerance Patient tolerated treatment well          Past Medical History:  Diagnosis Date   Seizure (HCC) 1993   History reviewed. No pertinent surgical history. 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 Yes; posterior head pain; 5/10; pressure; has not taken any medicine today   FALLS: Has patient fallen in last 6 months?  No  LIVING ENVIRONMENT: Lives with: lives with their family; Wife [name]) Kids, [name] and Brandon Lives in: House/apartment  PLOF:  Level of assistance: Independent with ADLs, Independent with IADLs Employment: Full-time employment, On disability; Reports for [employer] (unsure of title)    PATIENT GOALS: language  OBJECTIVE:  Note: Objective measures were completed at Evaluation unless otherwise noted.  DIAGNOSTIC FINDINGS: Limited on this EMR  COGNITION: Overall cognitive status: Impaired Areas of impairment:  Memory: Impaired: Short term Auditory Functional deficits: Needs to be further assessed in conjunction with language deficits.   AUDITORY COMPREHENSION: Overall auditory comprehension: Appears intact YES/NO questions: Appears intact Following directions: Appears intact Conversation: Complex Interfering components: working Research scientist (life sciences): extra processing time and repetition/stressing words  READING COMPREHENSION: Impaired: paragraph; reports slowed processing and taking longer to read multiple paragraphs. When assessed with single paragraph, pt scored 4/5 on comprehension questions.   EXPRESSION: verbal  VERBAL EXPRESSION: Level of generative/spontaneous verbalization: conversation Automatic speech: N/A Repetition: Appears intact @ sentence Naming: Confrontation: 51-75% and Divergent: impaired on CLQT Pragmatics: Appears intact Comments: NA Interfering components: NA Effective technique: sentence completion and descriptions Non-verbal means of communication: N/A  WRITTEN EXPRESSION: Dominant hand: right Written expression: Not tested  MOTOR SPEECH: Overall motor speech: Appears intact Level of impairment: Conversation Respiration: thoracic breathing Phonation: normal Resonance: WFL Articulation: Appears intact Intelligibility: Intelligible Motor planning: Appears intact Motor speech errors: NA Interfering components: NA Effective technique: NA  ORAL MOTOR EXAMINATION: Overall status: WFL Comments: reports reduced jaw ROM  STANDARDIZED ASSESSMENTS: Initiated CLQT - to complete next session  [employer] - Short Form: 6/15 (with pt reporting he did not actually know 2 items on this test)    PATIENT REPORTED  OUTCOME MEASURES (PROM): Communication Participation: 16/30; lower number indicating greater impact on QOL  TREATMENT DATE:   03/22/24: Pt was seen for skilled ST services targeting aphasia. Pt reports he has been having bad headaches, and standing up helps with the pain. SLP raised table and patient worked from standing today. SLP educated on semantic feature analysis (SFA) and demonstrated examples. Pt required min-modA to identify features of given object. To continue with SFA next session to continue to promote increased word finding using describe strategy.   03/17/24: Pt was seen for skilled ST services targeting aphasia. Pt reports  increased understanding and sympathy from wife in regards to communication after our therapy appointment.  Pt reports he is supposed to return to work on July 22nd, but does not feel ready. SLP encouraged pt to reach out to neurologist for further guidance. Based off deficits, SLP in agreement with postponing return to work due to aphasia and other cognitive deficits re: memory and attention. SLP educated on word finding strategies. Pt reported he would be most likely to re: delay, synonym, look it up, gesture. To continue with strategy practice nxt session.   03/14/24: Pt was seen for skilled ST services targeting aphasia and communication strategy education. Pt was accompanied by wife and niece who report communication difficulties at home. Slp reviewed term aphasia with family using whiteboard to describe expressive vs receptive language. Then, SLP educated on communication strategies (I.e. minimizing distractions, asking for clarification, giving extra time for processing, direct communication, manage fatigue etc.), provided relevant examples, and provided handouts. Wife reports they struggle at home with re: his attention, easily becoming  overwhelmed with noise, processing verbal information, and him expressing thoughts. Pt was in agreement with wife insights. To cont with word finding strategies next session.   03/08/24: Pt was seen for skilled ST services targeting continued assessment. SLP reviewed results of CLQT. Pt had most difficulty in sections requiring language re: story recall, divergent naming (concrete and abstract). Cognitive sections were WFL. Pt reports he has no difficulty understanding verbal information, but does struggle to recall verbal information which is consistent with where surgical site is located.     PATIENT EDUCATION: Education details: Aphasia, SLP role Person educated: Patient Education method: Explanation Education comprehension: needs further education   GOALS: Goals reviewed with patient? No  SHORT TERM GOALS: Target date: 04/06/24  Complete CLQT, PROMs, and update goals.  Baseline: Goal status: MET  2.  Pt will recall 3 word finding strategies to use in instances of anomia Baseline:  Goal status: INITIAL  3.  Pt will recall 2+ reading strategies to support comprehension of complex, written information Baseline:  Goal status: INITIAL  4.  Pt will recall 2 strategies to support active listening skills in conversations  Baseline:  Goal status: INITIAL    LONG TERM GOALS: Target date: 05/07/24  Improve score on PROMs Baseline:  Goal status: INITIAL  2.  Pt/wife will report successful use of supported communication strategies for aphasia.  Baseline:  Goal status: INITIAL  3.  Pt will report successful use of active listening skills in conversations Baseline:  Goal status: INITIAL  4.  Pt will report successful use of memory strategies in recall of important information.  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 See tx note. SLP rec skilled ST services  to address aphasia and cognitive-communication impairment.    OBJECTIVE IMPAIRMENTS: include memory and aphasia. These impairments are limiting patient from  return to work, managing medications, managing appointments, managing finances, household responsibilities, and effectively communicating at home and in community. Factors affecting potential to achieve goals and functional outcome are NA. Patient will benefit from skilled SLP services to address above impairments and improve overall function.  REHAB POTENTIAL: Good  PLAN:  SLP FREQUENCY: 2x/week  SLP DURATION: 8 weeks  PLANNED INTERVENTIONS: Environmental controls, Cueing hierachy, Internal/external aids, Functional tasks, Multimodal communication approach, SLP instruction and feedback, Compensatory strategies, Patient/family education, and [id number] Treatment of speech (30 or 45 min)     [employer], CCC-SLP 03/22/2024, 8:50 AM

## 2024-03-25 ENCOUNTER — Encounter: Payer: Self-pay | Admitting: Speech Pathology

## 2024-03-25 ENCOUNTER — Ambulatory Visit: Admitting: Speech Pathology

## 2024-03-25 DIAGNOSIS — R4701 Aphasia: Secondary | ICD-10-CM

## 2024-03-25 DIAGNOSIS — R41841 Cognitive communication deficit: Secondary | ICD-10-CM

## 2024-03-25 NOTE — Therapy (Signed)
 OUTPATIENT SPEECH LANGUAGE PATHOLOGY TREATMENT   Patient Name: Robert Byrd MRN: [id number] DOB:21-Jan-1991, 33 y.o., male Today's Date: 03/25/2024  PCP: Cleotilde Bernardino Hutchinson, PA-C REFERRING PROVIDER: Nettie Shasta Olam Beth, PA-C  END OF SESSION:  End of Session - 03/25/24 0934     Visit Number 6    Number of Visits 17    Date for SLP Re-Evaluation 05/07/24    SLP Start Time 0930    SLP Stop Time  1010    SLP Time Calculation (min) 40 min    Activity Tolerance Patient tolerated treatment well          Past Medical History:  Diagnosis Date   Seizure (HCC) 1993   History reviewed. No pertinent surgical history. 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 No  FALLS: Has patient fallen in last 6 months?  No  LIVING ENVIRONMENT: Lives with: lives with their family; Wife [name]) Kids, [name] and Heath Lives in: House/apartment  PLOF:  Level of assistance: Independent with ADLs, Independent with IADLs Employment: Full-time employment, On disability; Reports for [employer] (unsure of title)   PATIENT GOALS: language  OBJECTIVE:  Note: Objective measures were  completed at Evaluation unless otherwise noted.  DIAGNOSTIC FINDINGS: Limited on this EMR  COGNITION: Overall cognitive status: Impaired Areas of impairment:  Memory: Impaired: Short term Auditory Functional deficits: Needs to be further assessed in conjunction with language deficits.   AUDITORY COMPREHENSION: Overall auditory comprehension: Appears intact YES/NO questions: Appears intact Following directions: Appears intact Conversation: Complex Interfering components: working Research scientist (life sciences): extra processing time and repetition/stressing words  READING COMPREHENSION: Impaired: paragraph; reports slowed processing and taking longer to read multiple paragraphs. When assessed with single paragraph, pt scored 4/5 on comprehension questions.   EXPRESSION: verbal  VERBAL EXPRESSION: Level of generative/spontaneous verbalization: conversation Automatic speech: N/A Repetition: Appears intact @ sentence Naming: Confrontation: 51-75% and Divergent: impaired on CLQT Pragmatics: Appears intact Comments: NA Interfering components: NA Effective technique: sentence completion and descriptions Non-verbal means of communication: N/A  WRITTEN EXPRESSION: Dominant hand: right Written expression: Not tested  MOTOR SPEECH: Overall motor speech: Appears intact Level of impairment: Conversation Respiration: thoracic breathing Phonation: normal Resonance: WFL Articulation: Appears intact Intelligibility: Intelligible Motor planning: Appears intact Motor speech errors: NA Interfering components: NA Effective technique: NA  ORAL MOTOR EXAMINATION: Overall status: WFL Comments: reports reduced jaw ROM  STANDARDIZED ASSESSMENTS: Initiated CLQT - to complete next session  [employer] - Short Form: 6/15 (with pt reporting he did not actually know 2 items on this test)    PATIENT REPORTED OUTCOME MEASURES (PROM): Communication Participation: 16/30; lower number  indicating greater impact on QOL  TREATMENT DATE:   03/25/24: Pt was seen for skilled ST services targeting aphasia. SLP facilitated treatment by using Semantic Feature Analysis. Pt required minA to find words associated with the given object. SLP challenged pt with other word finding activities re: divergent naming in abstract categories, and convergent naming + adding of 2 items to given category. Pt required minA to complete examples. To attempt for HEP. Pt with questions about recovery, as he feels his is worsening. SLP explained this may be due to change in medicine, increased cognitive load and awareness of deficits. Pt appeared the same to SLP today. SLP encouraged pt to reach out to doctor and to go to ED if anything becomes significantly worse.   03/22/24: Pt was seen for skilled ST services targeting aphasia. Pt reports he has been having bad headaches, and standing up helps with the pain. SLP raised table and patient worked from standing today. SLP educated on semantic feature analysis (SFA) and demonstrated examples. Pt required min-modA to identify features of given object. To continue with SFA next session to continue to promote increased word finding using describe strategy.   03/17/24: Pt was seen for skilled ST services targeting aphasia. Pt reports  increased understanding and sympathy from wife in regards to communication after our therapy appointment.  Pt reports he is supposed to return to work on July 22nd, but does not feel ready. SLP encouraged pt to reach out to neurologist for further guidance. Based off deficits, SLP in agreement with postponing return to work due to aphasia and other cognitive deficits re: memory and attention. SLP educated on word finding strategies. Pt reported he would be most likely to re: delay, synonym, look it up, gesture. To continue with  strategy practice nxt session.   03/14/24: Pt was seen for skilled ST services targeting aphasia and communication strategy education. Pt was accompanied by wife and niece who report communication difficulties at home. Slp reviewed term aphasia with family using whiteboard to describe expressive vs receptive language. Then, SLP educated on communication strategies (I.e. minimizing distractions, asking for clarification, giving extra time for processing, direct communication, manage fatigue etc.), provided relevant examples, and provided handouts. Wife reports they struggle at home with re: his attention, easily becoming overwhelmed with noise, processing verbal information, and him expressing thoughts. Pt was in agreement with wife insights. To cont with word finding strategies next session.   03/08/24: Pt was seen for skilled ST services targeting continued assessment. SLP reviewed results of CLQT. Pt had most difficulty in sections requiring language re: story recall, divergent naming (concrete and abstract). Cognitive sections were WFL. Pt reports he has no difficulty understanding verbal information, but does struggle to recall verbal information which is consistent with where surgical site is located.     PATIENT EDUCATION: Education details: Aphasia, SLP role Person educated: Patient Education method: Explanation Education comprehension: needs further education   GOALS: Goals reviewed with patient? No  SHORT TERM GOALS: Target date: 04/06/24  Complete CLQT, PROMs, and update goals.  Baseline: Goal status: MET  2.  Pt will recall 3 word finding strategies to use in instances of anomia Baseline:  Goal status: INITIAL  3.  Pt will recall 2+ reading strategies to support comprehension of complex, written information Baseline:  Goal status: INITIAL  4.  Pt will recall 2 strategies to support active listening skills in conversations  Baseline:  Goal status: INITIAL    LONG TERM  GOALS: Target date: 05/07/24  Improve score on PROMs Baseline:  Goal status: INITIAL  2.  Pt/wife will report successful use of supported communication strategies for aphasia.  Baseline:  Goal status: INITIAL  3.  Pt will report successful use of active listening skills in conversations Baseline:  Goal status: INITIAL  4.  Pt will report successful use of memory strategies in recall of important information.  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 See tx note. SLP rec skilled ST services to address aphasia and cognitive-communication impairment.    OBJECTIVE IMPAIRMENTS: include memory and aphasia. These impairments are limiting patient from return to work, managing medications, managing appointments, managing finances, household responsibilities, and effectively communicating at home and in community. Factors affecting potential to achieve goals and functional outcome are NA. Patient will benefit from skilled SLP services to address above impairments and improve overall function.  REHAB POTENTIAL: Good  PLAN:  SLP FREQUENCY: 2x/week  SLP DURATION: 8 weeks  PLANNED INTERVENTIONS: Environmental controls, Cueing hierachy, Internal/external aids, Functional tasks, Multimodal communication approach, SLP instruction and feedback, Compensatory strategies, Patient/family education, and [id number] Treatment of speech (30 or 45 min)     [employer], CCC-SLP 03/25/2024, 9:34 AM

## 2024-03-28 ENCOUNTER — Encounter: Payer: Self-pay | Admitting: Speech Pathology

## 2024-03-28 ENCOUNTER — Ambulatory Visit: Admitting: Speech Pathology

## 2024-03-28 DIAGNOSIS — R4701 Aphasia: Secondary | ICD-10-CM

## 2024-03-28 NOTE — Therapy (Unsigned)
 OUTPATIENT SPEECH LANGUAGE PATHOLOGY TREATMENT   Patient Name: Robert Byrd MRN: [id number] DOB:Jun 27, 1991, 33 y.o., male Today's Date: 03/28/2024  PCP: Cleotilde Bernardino Hutchinson, PA-C REFERRING PROVIDER: Nettie Shasta Olam Beth, PA-C  END OF SESSION:  End of Session - 03/28/24 0853     Visit Number 7    Number of Visits 17    Date for SLP Re-Evaluation 05/07/24    SLP Start Time 0850    SLP Stop Time  0928    SLP Time Calculation (min) 38 min    Activity Tolerance Patient tolerated treatment well          Past Medical History:  Diagnosis Date   Seizure (HCC) 1993   History reviewed. No pertinent surgical history. 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  Pt accompanied by: self  PERTINENT HISTORY: epilepsy, LATL surgery  PAIN:  Are you having pain? Yes; over surgical site; 4/10   FALLS: Has patient fallen in last 6 months?  No  LIVING ENVIRONMENT: Lives with: lives with their family; Wife [name]) Kids, [name] and [place] Lives in: House/apartment  PLOF:  Level of assistance: Independent with ADLs, Independent with IADLs Employment: Full-time employment, On disability; Reports for [employer] (unsure of title)   PATIENT GOALS: language  OBJECTIVE:  Note:  Objective measures were completed at Evaluation unless otherwise noted.  DIAGNOSTIC FINDINGS: Limited on this EMR  COGNITION: Overall cognitive status: Impaired Areas of impairment:  Memory: Impaired: Short term Auditory Functional deficits: Needs to be further assessed in conjunction with language deficits.   AUDITORY COMPREHENSION: Overall auditory comprehension: Appears intact YES/NO questions: Appears intact Following directions: Appears intact Conversation: Complex Interfering components: working Research scientist (life sciences): extra processing time and repetition/stressing words  READING COMPREHENSION: Impaired: paragraph; reports slowed processing and taking longer to read multiple paragraphs. When assessed with single paragraph, pt scored 4/5 on comprehension questions.   EXPRESSION: verbal  VERBAL EXPRESSION: Level of generative/spontaneous verbalization: conversation Automatic speech: N/A Repetition: Appears intact @ sentence Naming: Confrontation: 51-75% and Divergent: impaired on CLQT Pragmatics: Appears intact Comments: NA Interfering components: NA Effective technique: sentence completion and descriptions Non-verbal means of communication: N/A  WRITTEN EXPRESSION: Dominant hand: right Written expression: Not tested  MOTOR SPEECH: Overall motor speech: Appears intact Level of impairment: Conversation Respiration: thoracic breathing Phonation: normal Resonance: WFL Articulation: Appears intact Intelligibility: Intelligible Motor planning: Appears intact Motor speech errors: NA Interfering components: NA Effective technique: NA  ORAL MOTOR EXAMINATION: Overall status: WFL Comments: reports reduced jaw ROM  STANDARDIZED ASSESSMENTS: Initiated CLQT - to complete next session  [employer] - Short Form: 6/15 (with pt reporting he did not actually know 2 items on this test)    PATIENT REPORTED OUTCOME MEASURES (PROM): Communication  Participation: 16/30; lower number indicating greater impact on QOL  TREATMENT DATE:   03/28/24: Pt was seen for skilled ST services targeting aphasia. Pt reports he had more word finding difficulty this weekend, but was also more fatigued as he was busy. Pt reports he tried delay in conversation and this was not successful. He attempted to describe and that was successful. T\  03/25/24: Pt was seen for skilled ST services targeting aphasia. SLP facilitated treatment by using Semantic Feature Analysis. Pt required minA to find words associated with the given object. SLP challenged pt with other word finding activities re: divergent naming in abstract categories, and convergent naming + adding of 2 items to given category. Pt required minA to complete examples. To attempt for HEP. Pt with questions about recovery, as he feels his is worsening. SLP explained this may be due to change in medicine, increased cognitive load and awareness of deficits. Pt appeared the same to SLP today. SLP encouraged pt to reach out to doctor and to go to ED if anything becomes significantly worse.   03/22/24: Pt was seen for skilled ST services targeting aphasia. Pt reports he has been having bad headaches, and standing up helps with the pain. SLP raised table and patient worked from standing today. SLP educated on semantic feature analysis (SFA) and demonstrated examples. Pt required min-modA to identify features of given object. To continue with SFA next session to continue to promote increased word finding using describe strategy.   03/17/24: Pt was seen for skilled ST services targeting aphasia. Pt reports  increased understanding and sympathy from wife in regards to communication after our therapy appointment.  Pt reports he is supposed to return to work on July 22nd, but does not feel ready. SLP  encouraged pt to reach out to neurologist for further guidance. Based off deficits, SLP in agreement with postponing return to work due to aphasia and other cognitive deficits re: memory and attention. SLP educated on word finding strategies. Pt reported he would be most likely to re: delay, synonym, look it up, gesture. To continue with strategy practice nxt session.   03/14/24: Pt was seen for skilled ST services targeting aphasia and communication strategy education. Pt was accompanied by wife and niece who report communication difficulties at home. Slp reviewed term aphasia with family using whiteboard to describe expressive vs receptive language. Then, SLP educated on communication strategies (I.e. minimizing distractions, asking for clarification, giving extra time for processing, direct communication, manage fatigue etc.), provided relevant examples, and provided handouts. Wife reports they struggle at home with re: his attention, easily becoming overwhelmed with noise, processing verbal information, and him expressing thoughts. Pt was in agreement with wife insights. To cont with word finding strategies next session.   03/08/24: Pt was seen for skilled ST services targeting continued assessment. SLP reviewed results of CLQT. Pt had most difficulty in sections requiring language re: story recall, divergent naming (concrete and abstract). Cognitive sections were WFL. Pt reports he has no difficulty understanding verbal information, but does struggle to recall verbal information which is consistent with where surgical site is located.     PATIENT EDUCATION: Education details: Aphasia, SLP role Person educated: Patient Education method: Explanation Education comprehension: needs further education   GOALS: Goals reviewed with patient? No  SHORT TERM GOALS: Target date: 04/06/24  Complete CLQT, PROMs, and update goals.  Baseline: Goal status: MET  2.  Pt will recall 3 word finding strategies  to use in instances of anomia Baseline:  Goal status: INITIAL  3.  Pt will recall  2+ reading strategies to support comprehension of complex, written information Baseline:  Goal status: INITIAL  4.  Pt will recall 2 strategies to support active listening skills in conversations  Baseline:  Goal status: INITIAL    LONG TERM GOALS: Target date: 05/07/24  Improve score on PROMs Baseline:  Goal status: INITIAL  2.  Pt/wife will report successful use of supported communication strategies for aphasia.  Baseline:  Goal status: INITIAL  3.  Pt will report successful use of active listening skills in conversations Baseline:  Goal status: INITIAL  4.  Pt will report successful use of memory strategies in recall of important information.  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 See tx note. SLP rec skilled ST services to address aphasia and cognitive-communication impairment.    OBJECTIVE IMPAIRMENTS: include memory and aphasia. These impairments are limiting patient from return to work, managing medications, managing appointments, managing finances, household responsibilities, and effectively communicating at home and in community. Factors affecting potential to achieve goals and functional outcome are NA. Patient will benefit from skilled SLP services to address above impairments and improve overall function.  REHAB POTENTIAL: Good  PLAN:  SLP FREQUENCY: 2x/week  SLP DURATION: 8 weeks  PLANNED INTERVENTIONS: Environmental controls, Cueing hierachy, Internal/external aids, Functional tasks, Multimodal communication approach, SLP instruction and feedback, Compensatory strategies, Patient/family education, and [id number] Treatment of speech (30 or 45 min)     [employer], CCC-SLP 03/28/2024, 8:54 AM

## 2024-03-29 ENCOUNTER — Ambulatory Visit: Admitting: Speech Pathology

## 2024-03-31 ENCOUNTER — Ambulatory Visit: Admitting: Speech Pathology

## 2024-03-31 ENCOUNTER — Encounter: Payer: Self-pay | Admitting: Speech Pathology

## 2024-03-31 DIAGNOSIS — R4701 Aphasia: Secondary | ICD-10-CM

## 2024-03-31 NOTE — Therapy (Unsigned)
 OUTPATIENT SPEECH LANGUAGE PATHOLOGY TREATMENT   Patient Name: Robert Byrd MRN: [id number] DOB:08/03/1991, 33 y.o., male Today's Date: 03/31/2024  PCP: Cleotilde Bernardino Hutchinson, PA-C REFERRING PROVIDER: Nettie Shasta Olam Beth, PA-C  END OF SESSION:  End of Session - 03/31/24 0935     Visit Number 8    Number of Visits 17    Date for SLP Re-Evaluation 05/07/24    SLP Start Time 0932    SLP Stop Time  1012    SLP Time Calculation (min) 40 min    Activity Tolerance Patient tolerated treatment well          Past Medical History:  Diagnosis Date   Seizure (HCC) 1993   History reviewed. No pertinent surgical history. 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   Pt accompanied by: self  PERTINENT HISTORY: epilepsy, LATL surgery  PAIN:  Are you having pain? Yes; headache 1/10.   FALLS: Has patient fallen in last 6 months?  No  LIVING ENVIRONMENT: Lives with: lives with their family; Wife [name]) Kids, [name] and [place] Lives in: House/apartment  PLOF:  Level of assistance: Independent with ADLs, Independent with IADLs Employment: Full-time employment, On disability; Reports for [employer] (unsure of title)   PATIENT GOALS: language  OBJECTIVE:  Note: Objective measures were completed  at Evaluation unless otherwise noted.  DIAGNOSTIC FINDINGS: Limited on this EMR  COGNITION: Overall cognitive status: Impaired Areas of impairment:  Memory: Impaired: Short term Auditory Functional deficits: Needs to be further assessed in conjunction with language deficits.   AUDITORY COMPREHENSION: Overall auditory comprehension: Appears intact YES/NO questions: Appears intact Following directions: Appears intact Conversation: Complex Interfering components: working Research scientist (life sciences): extra processing time and repetition/stressing words  READING COMPREHENSION: Impaired: paragraph; reports slowed processing and taking longer to read multiple paragraphs. When assessed with single paragraph, pt scored 4/5 on comprehension questions.   EXPRESSION: verbal  VERBAL EXPRESSION: Level of generative/spontaneous verbalization: conversation Automatic speech: N/A Repetition: Appears intact @ sentence Naming: Confrontation: 51-75% and Divergent: impaired on CLQT Pragmatics: Appears intact Comments: NA Interfering components: NA Effective technique: sentence completion and descriptions Non-verbal means of communication: N/A  WRITTEN EXPRESSION: Dominant hand: right Written expression: Not tested  MOTOR SPEECH: Overall motor speech: Appears intact Level of impairment: Conversation Respiration: thoracic breathing Phonation: normal Resonance: WFL Articulation: Appears intact Intelligibility: Intelligible Motor planning: Appears intact Motor speech errors: NA Interfering components: NA Effective technique: NA  ORAL MOTOR EXAMINATION: Overall status: WFL Comments: reports reduced jaw ROM  STANDARDIZED ASSESSMENTS: Initiated CLQT - to complete next session  [employer] - Short Form: 6/15 (with pt reporting he did not actually know 2 items on this test)    PATIENT REPORTED OUTCOME MEASURES (PROM): Communication Participation: 16/30; lower number indicating  greater impact on QOL  TREATMENT DATE:   03/31/24: Pt was seen for skilled ST services targeting aphasia. Pt reported he had an instance of trying to find a word and was stuck. He reports he feels delay has been working to help retrieve the word faster. Pt with complaints regarding reading - SLP provided education and demonstration of reading strategies re: enlarging font, using finger/pencil to underline as he is reading, reading text aloud. Pt also reports trouble with memory. To provide memory strategies next session.   03/28/24: Pt was seen for skilled ST services targeting aphasia. Pt reports he had more word finding difficulty this weekend, but was also more fatigued as he was busy. Pt reports he tried delay in conversation and this was not successful. He attempted to describe and that was successful. SLP facilitated look it up during SFA task. He required minA reminders to use phone to google. Pt was able to provide 4+ features, but had some difficulty with lower frequency word finding. To cont   03/25/24: Pt was seen for skilled ST services targeting aphasia. SLP facilitated treatment by using Semantic Feature Analysis. Pt required minA to find words associated with the given object. SLP challenged pt with other word finding activities re: divergent naming in abstract categories, and convergent naming + adding of 2 items to given category. Pt required minA to complete examples. To attempt for HEP. Pt with questions about recovery, as he feels his is worsening. SLP explained this may be due to change in medicine, increased cognitive load and awareness of deficits. Pt appeared the same to SLP today. SLP encouraged pt to reach out to doctor and to go to ED if anything becomes significantly worse.   03/22/24: Pt was seen for skilled ST services targeting aphasia. Pt reports he has  been having bad headaches, and standing up helps with the pain. SLP raised table and patient worked from standing today. SLP educated on semantic feature analysis (SFA) and demonstrated examples. Pt required min-modA to identify features of given object. To continue with SFA next session to continue to promote increased word finding using describe strategy.   03/17/24: Pt was seen for skilled ST services targeting aphasia. Pt reports  increased understanding and sympathy from wife in regards to communication after our therapy appointment.  Pt reports he is supposed to return to work on July 22nd, but does not feel ready. SLP encouraged pt to reach out to neurologist for further guidance. Based off deficits, SLP in agreement with postponing return to work due to aphasia and other cognitive deficits re: memory and attention. SLP educated on word finding strategies. Pt reported he would be most likely to re: delay, synonym, look it up, gesture. To continue with strategy practice nxt session.   03/14/24: Pt was seen for skilled ST services targeting aphasia and communication strategy education. Pt was accompanied by wife and niece who report communication difficulties at home. Slp reviewed term aphasia with family using whiteboard to describe expressive vs receptive language. Then, SLP educated on communication strategies (I.e. minimizing distractions, asking for clarification, giving extra time for processing, direct communication, manage fatigue etc.), provided relevant examples, and provided handouts. Wife reports they struggle at home with re: his attention, easily becoming overwhelmed with noise, processing verbal information, and him expressing thoughts. Pt was in agreement with wife insights. To cont with word finding strategies next session.   03/08/24: Pt was seen for skilled ST services targeting continued assessment. SLP reviewed results of CLQT. Pt had most difficulty in sections  requiring language re:  story recall, divergent naming (concrete and abstract). Cognitive sections were WFL. Pt reports he has no difficulty understanding verbal information, but does struggle to recall verbal information which is consistent with where surgical site is located.     PATIENT EDUCATION: Education details: Aphasia, SLP role Person educated: Patient Education method: Explanation Education comprehension: needs further education   GOALS: Goals reviewed with patient? No  SHORT TERM GOALS: Target date: 04/06/24  Complete CLQT, PROMs, and update goals.  Baseline: Goal status: MET  2.  Pt will recall 3 word finding strategies to use in instances of anomia Baseline:  Goal status: INITIAL  3.  Pt will recall 2+ reading strategies to support comprehension of complex, written information Baseline:  Goal status: INITIAL  4.  Pt will recall 2 strategies to support active listening skills in conversations  Baseline:  Goal status: INITIAL    LONG TERM GOALS: Target date: 05/07/24  Improve score on PROMs Baseline:  Goal status: INITIAL  2.  Pt/wife will report successful use of supported communication strategies for aphasia.  Baseline:  Goal status: INITIAL  3.  Pt will report successful use of active listening skills in conversations Baseline:  Goal status: INITIAL  4.  Pt will report successful use of memory strategies in recall of important information.  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 See tx note. SLP rec skilled ST services to address aphasia and cognitive-communication impairment.    OBJECTIVE IMPAIRMENTS: include memory and aphasia. These impairments are limiting patient from return to work, managing medications, managing appointments, managing finances, household responsibilities, and effectively communicating at home and in community. Factors affecting  potential to achieve goals and functional outcome are NA. Patient will benefit from skilled SLP services to address above impairments and improve overall function.  REHAB POTENTIAL: Good  PLAN:  SLP FREQUENCY: 2x/week  SLP DURATION: 8 weeks  PLANNED INTERVENTIONS: Environmental controls, Cueing hierachy, Internal/external aids, Functional tasks, Multimodal communication approach, SLP instruction and feedback, Compensatory strategies, Patient/family education, and [id number] Treatment of speech (30 or 45 min)     [employer], CCC-SLP 03/31/2024, 9:35 AM

## 2024-04-04 ENCOUNTER — Ambulatory Visit: Admitting: Speech Pathology

## 2024-04-04 ENCOUNTER — Encounter: Payer: Self-pay | Admitting: Speech Pathology

## 2024-04-04 DIAGNOSIS — R4701 Aphasia: Secondary | ICD-10-CM | POA: Diagnosis not present

## 2024-04-04 NOTE — Therapy (Signed)
 OUTPATIENT SPEECH LANGUAGE PATHOLOGY TREATMENT   Patient Name: Robert Byrd MRN: [id number] DOB:06/15/1991, 33 y.o., male Today's Date: 04/04/2024  PCP: Cleotilde Bernardino Hutchinson, PA-C REFERRING PROVIDER: Nettie Shasta Olam Beth, PA-C  END OF SESSION:  End of Session - 04/04/24 0934     Visit Number 9    Number of Visits 17    Date for SLP Re-Evaluation 05/07/24    SLP Start Time 0932    SLP Stop Time  1015    SLP Time Calculation (min) 43 min    Activity Tolerance Patient tolerated treatment well          Past Medical History:  Diagnosis Date   Seizure (HCC) 1993   History reviewed. No pertinent surgical history. 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   Pt accompanied by: self  PERTINENT HISTORY: epilepsy, LATL surgery  PAIN:  Are you having pain? Yes; headache 1/10.   FALLS: Has patient fallen in last 6 months?  No  LIVING ENVIRONMENT: Lives with: lives with their family; Wife [name]) Kids, [name] and [name] Lives in: House/apartment  PLOF:  Level of assistance: Independent with ADLs, Independent with IADLs Employment: Full-time employment, On disability; Reports for [employer] (unsure of title)   PATIENT GOALS: language  OBJECTIVE:  Note: Objective measures were completed  at Evaluation unless otherwise noted.  DIAGNOSTIC FINDINGS: Limited on this EMR  COGNITION: Overall cognitive status: Impaired Areas of impairment:  Memory: Impaired: Short term Auditory Functional deficits: Needs to be further assessed in conjunction with language deficits.   AUDITORY COMPREHENSION: Overall auditory comprehension: Appears intact YES/NO questions: Appears intact Following directions: Appears intact Conversation: Complex Interfering components: working Research scientist (life sciences): extra processing time and repetition/stressing words  READING COMPREHENSION: Impaired: paragraph; reports slowed processing and taking longer to read multiple paragraphs. When assessed with single paragraph, pt scored 4/5 on comprehension questions.   EXPRESSION: verbal  VERBAL EXPRESSION: Level of generative/spontaneous verbalization: conversation Automatic speech: N/A Repetition: Appears intact @ sentence Naming: Confrontation: 51-75% and Divergent: impaired on CLQT Pragmatics: Appears intact Comments: NA Interfering components: NA Effective technique: sentence completion and descriptions Non-verbal means of communication: N/A  WRITTEN EXPRESSION: Dominant hand: right Written expression: Not tested  MOTOR SPEECH: Overall motor speech: Appears intact Level of impairment: Conversation Respiration: thoracic breathing Phonation: normal Resonance: WFL Articulation: Appears intact Intelligibility: Intelligible Motor planning: Appears intact Motor speech errors: NA Interfering components: NA Effective technique: NA  ORAL MOTOR EXAMINATION: Overall status: WFL Comments: reports reduced jaw ROM  STANDARDIZED ASSESSMENTS: Initiated CLQT - to complete next session  [employer] - Short Form: 6/15 (with pt reporting he did not actually know 2 items on this test)    PATIENT REPORTED OUTCOME MEASURES (PROM): Communication Participation: 16/30; lower number indicating  greater impact on QOL  TREATMENT DATE:   04/04/24: Pt was seen for skilled ST services targeting cognitive-communication. Pt reports he has been finding words faster, but has also experienced increased instances of anomia. Pt also reports losing train of thought - only able to hold thoughts during a conversation for around 30 seconds. He hasn't not had the opportunity to use any reading strategies in the past several days, so unable to comment on the success. SLP provided education on attention strategies, specifically active listening skills re: clarification, probing questions, paraphrasing, and summarizing. We developed a plan for conversations re: jotting thoughts down in conversation (in phone) and then asking for repetition, or talking with wife to allow more breaks in conversation to allow for pt to share thoughts before they disappear.     03/31/24: Pt was seen for skilled ST services targeting aphasia. Pt reported he had an instance of trying to find a word and was stuck. He reports he feels delay has been working to help retrieve the word faster. Pt with complaints regarding reading - SLP provided education and demonstration of reading strategies re: enlarging font, using finger/pencil to underline as he is reading, reading text aloud. Pt also reports trouble with memory. To provide memory strategies next session.   03/28/24: Pt was seen for skilled ST services targeting aphasia. Pt reports he had more word finding difficulty this weekend, but was also more fatigued as he was busy. Pt reports he tried delay in conversation and this was not successful. He attempted to describe and that was successful. SLP facilitated look it up during SFA task. He required minA reminders to use phone to google. Pt was able to provide 4+ features, but had some difficulty with lower frequency  word finding. To cont   03/25/24: Pt was seen for skilled ST services targeting aphasia. SLP facilitated treatment by using Semantic Feature Analysis. Pt required minA to find words associated with the given object. SLP challenged pt with other word finding activities re: divergent naming in abstract categories, and convergent naming + adding of 2 items to given category. Pt required minA to complete examples. To attempt for HEP. Pt with questions about recovery, as he feels his is worsening. SLP explained this may be due to change in medicine, increased cognitive load and awareness of deficits. Pt appeared the same to SLP today. SLP encouraged pt to reach out to doctor and to go to ED if anything becomes significantly worse.   03/22/24: Pt was seen for skilled ST services targeting aphasia. Pt reports he has been having bad headaches, and standing up helps with the pain. SLP raised table and patient worked from standing today. SLP educated on semantic feature analysis (SFA) and demonstrated examples. Pt required min-modA to identify features of given object. To continue with SFA next session to continue to promote increased word finding using describe strategy.   03/17/24: Pt was seen for skilled ST services targeting aphasia. Pt reports  increased understanding and sympathy from wife in regards to communication after our therapy appointment.  Pt reports he is supposed to return to work on July 22nd, but does not feel ready. SLP encouraged pt to reach out to neurologist for further guidance. Based off deficits, SLP in agreement with postponing return to work due to aphasia and other cognitive deficits re: memory and attention. SLP educated on word finding strategies. Pt reported he would be most likely to re: delay, synonym, look it up, gesture. To continue with strategy practice nxt session.   03/14/24:  Pt was seen for skilled ST services targeting aphasia and communication strategy education. Pt was  accompanied by wife and niece who report communication difficulties at home. Slp reviewed term aphasia with family using whiteboard to describe expressive vs receptive language. Then, SLP educated on communication strategies (I.e. minimizing distractions, asking for clarification, giving extra time for processing, direct communication, manage fatigue etc.), provided relevant examples, and provided handouts. Wife reports they struggle at home with re: his attention, easily becoming overwhelmed with noise, processing verbal information, and him expressing thoughts. Pt was in agreement with wife insights. To cont with word finding strategies next session.   03/08/24: Pt was seen for skilled ST services targeting continued assessment. SLP reviewed results of CLQT. Pt had most difficulty in sections requiring language re: story recall, divergent naming (concrete and abstract). Cognitive sections were WFL. Pt reports he has no difficulty understanding verbal information, but does struggle to recall verbal information which is consistent with where surgical site is located.     PATIENT EDUCATION: Education details: Aphasia, SLP role Person educated: Patient Education method: Explanation Education comprehension: needs further education   GOALS: Goals reviewed with patient? No  SHORT TERM GOALS: Target date: 04/06/24  Complete CLQT, PROMs, and update goals.  Baseline: Goal status: MET  2.  Pt will recall 3 word finding strategies to use in instances of anomia Baseline:  Goal status: INITIAL  3.  Pt will recall 2+ reading strategies to support comprehension of complex, written information Baseline:  Goal status: INITIAL  4.  Pt will recall 2 strategies to support active listening skills in conversations  Baseline:  Goal status: INITIAL    LONG TERM GOALS: Target date: 05/07/24  Improve score on PROMs Baseline:  Goal status: INITIAL  2.  Pt/wife will report successful use of supported  communication strategies for aphasia.  Baseline:  Goal status: INITIAL  3.  Pt will report successful use of active listening skills in conversations Baseline:  Goal status: INITIAL  4.  Pt will report successful use of memory strategies in recall of important information.  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 See tx note. SLP rec skilled ST services to address aphasia and cognitive-communication impairment.    OBJECTIVE IMPAIRMENTS: include memory and aphasia. These impairments are limiting patient from return to work, managing medications, managing appointments, managing finances, household responsibilities, and effectively communicating at home and in community. Factors affecting potential to achieve goals and functional outcome are NA. Patient will benefit from skilled SLP services to address above impairments and improve overall function.  REHAB POTENTIAL: Good  PLAN:  SLP FREQUENCY: 2x/week  SLP DURATION: 8 weeks  PLANNED INTERVENTIONS: Environmental controls, Cueing hierachy, Internal/external aids, Functional tasks, Multimodal communication approach, SLP instruction and feedback, Compensatory strategies, Patient/family education, and [id number] Treatment of speech (30 or 45 min)     [employer], CCC-SLP 04/04/2024, 9:35 AM

## 2024-04-10 ENCOUNTER — Other Ambulatory Visit: Payer: Self-pay | Admitting: Neurology

## 2024-04-14 ENCOUNTER — Encounter: Payer: Self-pay | Admitting: Speech Pathology

## 2024-04-14 ENCOUNTER — Ambulatory Visit: Payer: Self-pay | Admitting: Speech Pathology

## 2024-04-14 DIAGNOSIS — R4701 Aphasia: Secondary | ICD-10-CM

## 2024-04-14 NOTE — Therapy (Signed)
 OUTPATIENT SPEECH LANGUAGE PATHOLOGY TREATMENT   Patient Name: Robert Byrd MRN: [id number] DOB:19-May-1991, 33 y.o., male Today's Date: 04/14/2024  PCP: Cleotilde Bernardino Hutchinson, PA-C REFERRING PROVIDER: Nettie Shasta Olam Beth, PA-C  END OF SESSION:  End of Session - 04/14/24 0849     Visit Number 10    Number of Visits 17    Date for SLP Re-Evaluation 05/07/24    SLP Start Time 0845    SLP Stop Time  0925    SLP Time Calculation (min) 40 min    Activity Tolerance Patient tolerated treatment well          Past Medical History:  Diagnosis Date   Seizure (HCC) 1993   History reviewed. No pertinent surgical history. 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   Pt accompanied by: self  PERTINENT HISTORY: epilepsy, LATL surgery  PAIN:  Are you having pain? Yes; headache 3/10.   FALLS: Has patient fallen in last 6 months?  No  LIVING ENVIRONMENT: Lives with: lives with their family; Wife [name]) Kids, [name] and [place] Lives in: House/apartment  PLOF:  Level of assistance: Independent with ADLs, Independent with IADLs Employment: Full-time employment, On disability; Reports for [employer] (unsure of title)   PATIENT GOALS: language  OBJECTIVE:  Note: Objective measures were completed  at Evaluation unless otherwise noted.  DIAGNOSTIC FINDINGS: Limited on this EMR  COGNITION: Overall cognitive status: Impaired Areas of impairment:  Memory: Impaired: Short term Auditory Functional deficits: Needs to be further assessed in conjunction with language deficits.   AUDITORY COMPREHENSION: Overall auditory comprehension: Appears intact YES/NO questions: Appears intact Following directions: Appears intact Conversation: Complex Interfering components: working Research scientist (life sciences): extra processing time and repetition/stressing words  READING COMPREHENSION: Impaired: paragraph; reports slowed processing and taking longer to read multiple paragraphs. When assessed with single paragraph, pt scored 4/5 on comprehension questions.   EXPRESSION: verbal  VERBAL EXPRESSION: Level of generative/spontaneous verbalization: conversation Automatic speech: N/A Repetition: Appears intact @ sentence Naming: Confrontation: 51-75% and Divergent: impaired on CLQT Pragmatics: Appears intact Comments: NA Interfering components: NA Effective technique: sentence completion and descriptions Non-verbal means of communication: N/A  WRITTEN EXPRESSION: Dominant hand: right Written expression: Not tested  MOTOR SPEECH: Overall motor speech: Appears intact Level of impairment: Conversation Respiration: thoracic breathing Phonation: normal Resonance: WFL Articulation: Appears intact Intelligibility: Intelligible Motor planning: Appears intact Motor speech errors: NA Interfering components: NA Effective technique: NA  ORAL MOTOR EXAMINATION: Overall status: WFL Comments: reports reduced jaw ROM  STANDARDIZED ASSESSMENTS: Initiated CLQT - to complete next session  [employer] - Short Form: 6/15 (with pt reporting he did not actually know 2 items on this test)    PATIENT REPORTED OUTCOME MEASURES (PROM): Communication Participation: 16/30; lower number indicating  greater impact on QOL  TREATMENT DATE:   04/14/24: Pt  was seen for skilled ST services targeting cognitive-communication. Pt reports he has only had one communication breakdown in the last week., where he was unable to get his point across. Pt reports trouble with abstract divergent naming task for HEP - wife assisted with cueing at home. Pt reports he has not been reading, so he has not implemented reading strategies. Pt reports he is asking for repetition and asking for breaks in conversation which have been helpful in allowing him to express his thought before losing it. Pt reports he is having trouble with memory - provided example of a conversation this morning with his wife re: he was in the closet picking out clothes and having a conversation simultaneously with his wife - he completely forgot what he had said to his wife. SLP suspects this is likely due to divided attention. SLP reviewed active listening skills from previous session and encouraged him to remain fully ENGAGED in every conversation - making sure to eliminate all distractions. Pt reports his memory of conversations makes him nervous about returning to work, as he has several meetings and projects he has to complete. We briefly discussed lists and prioritization of tasks. Pt asked, are there notes or something so I can remember everything we talk about? SLP suggested pt 1. Look at [employer], 2. Bring notebook/tablet/computer to take notes during therapy to assist with active engagement in conversations.   04/04/24: Pt was seen for skilled ST services targeting cognitive-communication. Pt reports he has been finding words faster, but has also experienced increased instances of anomia. Pt also reports losing train of thought - only able to hold thoughts during a conversation for around 30 seconds. He hasn't not had the  opportunity to use any reading strategies in the past several days, so unable to comment on the success. SLP provided education on attention strategies, specifically active listening skills re: clarification, probing questions, paraphrasing, and summarizing. We developed a plan for conversations re: jotting thoughts down in conversation (in phone) and then asking for repetition, or talking with wife to allow more breaks in conversation to allow for pt to share thoughts before they disappear.    03/31/24: Pt was seen for skilled ST services targeting aphasia. Pt reported he had an instance of trying to find a word and was stuck. He reports he feels delay has been working to help retrieve the word faster. Pt with complaints regarding reading - SLP provided education and demonstration of reading strategies re: enlarging font, using finger/pencil to underline as he is reading, reading text aloud. Pt also reports trouble with memory. To provide memory strategies next session.   03/28/24: Pt was seen for skilled ST services targeting aphasia. Pt reports he had more word finding difficulty this weekend, but was also more fatigued as he was busy. Pt reports he tried delay in conversation and this was not successful. He attempted to describe and that was successful. SLP facilitated look it up during SFA task. He required minA reminders to use phone to google. Pt was able to provide 4+ features, but had some difficulty with lower frequency word finding. To cont   03/25/24: Pt was seen for skilled ST services targeting aphasia. SLP facilitated treatment by using Semantic Feature Analysis. Pt required minA to find words associated with the given object. SLP challenged pt with other word finding activities re: divergent naming in abstract categories, and convergent naming + adding of 2 items to given category. Pt required minA  to complete examples. To attempt for HEP. Pt with questions about recovery, as he feels his is  worsening. SLP explained this may be due to change in medicine, increased cognitive load and awareness of deficits. Pt appeared the same to SLP today. SLP encouraged pt to reach out to doctor and to go to ED if anything becomes significantly worse.   03/22/24: Pt was seen for skilled ST services targeting aphasia. Pt reports he has been having bad headaches, and standing up helps with the pain. SLP raised table and patient worked from standing today. SLP educated on semantic feature analysis (SFA) and demonstrated examples. Pt required min-modA to identify features of given object. To continue with SFA next session to continue to promote increased word finding using describe strategy.   03/17/24: Pt was seen for skilled ST services targeting aphasia. Pt reports  increased understanding and sympathy from wife in regards to communication after our therapy appointment.  Pt reports he is supposed to return to work on July 22nd, but does not feel ready. SLP encouraged pt to reach out to neurologist for further guidance. Based off deficits, SLP in agreement with postponing return to work due to aphasia and other cognitive deficits re: memory and attention. SLP educated on word finding strategies. Pt reported he would be most likely to re: delay, synonym, look it up, gesture. To continue with strategy practice nxt session.   03/14/24: Pt was seen for skilled ST services targeting aphasia and communication strategy education. Pt was accompanied by wife and niece who report communication difficulties at home. Slp reviewed term aphasia with family using whiteboard to describe expressive vs receptive language. Then, SLP educated on communication strategies (I.e. minimizing distractions, asking for clarification, giving extra time for processing, direct communication, manage fatigue etc.), provided relevant examples, and provided handouts. Wife reports they struggle at home with re: his attention, easily becoming  overwhelmed with noise, processing verbal information, and him expressing thoughts. Pt was in agreement with wife insights. To cont with word finding strategies next session.   03/08/24: Pt was seen for skilled ST services targeting continued assessment. SLP reviewed results of CLQT. Pt had most difficulty in sections requiring language re: story recall, divergent naming (concrete and abstract). Cognitive sections were WFL. Pt reports he has no difficulty understanding verbal information, but does struggle to recall verbal information which is consistent with where surgical site is located.     PATIENT EDUCATION: Education details: Aphasia, SLP role Person educated: Patient Education method: Explanation Education comprehension: needs further education   GOALS: Goals reviewed with patient? No  SHORT TERM GOALS: Target date: 04/06/24  Complete CLQT, PROMs, and update goals.  Baseline: Goal status: MET  2.  Pt will recall 3 word finding strategies to use in instances of anomia Baseline:  Goal status: INITIAL  3.  Pt will recall 2+ reading strategies to support comprehension of complex, written information Baseline:  Goal status: INITIAL  4.  Pt will recall 2 strategies to support active listening skills in conversations  Baseline:  Goal status: INITIAL    LONG TERM GOALS: Target date: 05/07/24  Improve score on PROMs Baseline:  Goal status: INITIAL  2.  Pt/wife will report successful use of supported communication strategies for aphasia.  Baseline:  Goal status: INITIAL  3.  Pt will report successful use of active listening skills in conversations Baseline:  Goal status: INITIAL  4.  Pt will report successful use of memory strategies in recall of important information.  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 See tx note. SLP rec skilled ST services  to address aphasia and cognitive-communication impairment.    OBJECTIVE IMPAIRMENTS: include memory and aphasia. These impairments are limiting patient from return to work, managing medications, managing appointments, managing finances, household responsibilities, and effectively communicating at home and in community. Factors affecting potential to achieve goals and functional outcome are NA. Patient will benefit from skilled SLP services to address above impairments and improve overall function.  REHAB POTENTIAL: Good  PLAN:  SLP FREQUENCY: 2x/week  SLP DURATION: 8 weeks  PLANNED INTERVENTIONS: Environmental controls, Cueing hierachy, Internal/external aids, Functional tasks, Multimodal communication approach, SLP instruction and feedback, Compensatory strategies, Patient/family education, and [id number] Treatment of speech (30 or 45 min)     [employer], CCC-SLP 04/14/2024, 8:50 AM

## 2024-04-20 ENCOUNTER — Ambulatory Visit: Payer: Self-pay | Attending: Neurosurgery | Admitting: Speech Pathology

## 2024-04-20 ENCOUNTER — Encounter: Payer: Self-pay | Admitting: Speech Pathology

## 2024-04-20 DIAGNOSIS — R4701 Aphasia: Secondary | ICD-10-CM | POA: Insufficient documentation

## 2024-04-20 DIAGNOSIS — R41841 Cognitive communication deficit: Secondary | ICD-10-CM | POA: Insufficient documentation

## 2024-04-20 NOTE — Therapy (Unsigned)
 OUTPATIENT SPEECH LANGUAGE PATHOLOGY TREATMENT   Patient Name: Robert Byrd MRN: [id number] DOB:1990-10-12, 32 y.o., male Today's Date: 04/20/2024  PCP: Cleotilde Bernardino Hutchinson, PA-C REFERRING PROVIDER: Nettie Shasta Olam Beth, PA-C  END OF SESSION:  End of Session - 04/20/24 0934     Visit Number 11    Number of Visits 17    Date for SLP Re-Evaluation 05/07/24    SLP Start Time 0932    SLP Stop Time  1015    SLP Time Calculation (min) 43 min    Activity Tolerance Patient tolerated treatment well          Past Medical History:  Diagnosis Date   Seizure (HCC) 1993   History reviewed. No pertinent surgical history. 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   Pt accompanied by: self  PERTINENT HISTORY: epilepsy, LATL surgery  PAIN:  Are you having pain? No .=  FALLS: Has patient fallen in last 6 months?  No  LIVING ENVIRONMENT: Lives with: lives with their family; Wife [name]) Kids, [name] and [place] Lives in: House/apartment  PLOF:  Level of assistance: Independent with ADLs, Independent with IADLs Employment: Full-time employment, On disability; Reports for [employer] (unsure of title)   PATIENT GOALS: language  OBJECTIVE:  Note: Objective measures were completed at Evaluation unless otherwise  noted.  DIAGNOSTIC FINDINGS: Limited on this EMR  COGNITION: Overall cognitive status: Impaired Areas of impairment:  Memory: Impaired: Short term Auditory Functional deficits: Needs to be further assessed in conjunction with language deficits.   AUDITORY COMPREHENSION: Overall auditory comprehension: Appears intact YES/NO questions: Appears intact Following directions: Appears intact Conversation: Complex Interfering components: working Research scientist (life sciences): extra processing time and repetition/stressing words  READING COMPREHENSION: Impaired: paragraph; reports slowed processing and taking longer to read multiple paragraphs. When assessed with single paragraph, pt scored 4/5 on comprehension questions.   EXPRESSION: verbal  VERBAL EXPRESSION: Level of generative/spontaneous verbalization: conversation Automatic speech: N/A Repetition: Appears intact @ sentence Naming: Confrontation: 51-75% and Divergent: impaired on CLQT Pragmatics: Appears intact Comments: NA Interfering components: NA Effective technique: sentence completion and descriptions Non-verbal means of communication: N/A  WRITTEN EXPRESSION: Dominant hand: right Written expression: Not tested  MOTOR SPEECH: Overall motor speech: Appears intact Level of impairment: Conversation Respiration: thoracic breathing Phonation: normal Resonance: WFL Articulation: Appears intact Intelligibility: Intelligible Motor planning: Appears intact Motor speech errors: NA Interfering components: NA Effective technique: NA  ORAL MOTOR EXAMINATION: Overall status: WFL Comments: reports reduced jaw ROM  STANDARDIZED ASSESSMENTS: Initiated CLQT - to complete next session  [employer] - Short Form: 6/15 (with pt reporting he did not actually know 2 items on this test)    PATIENT REPORTED OUTCOME MEASURES (PROM): Communication Participation: 16/30; lower number indicating greater impact on QOL  TREATMENT DATE:   04/20/24: Pt was seen for skilled ST services targeting cognitive-communication. He feels word finding is getting better with less frequency.  Pt forgot to bring paper/pencil/computer to take notes during session. SLP facilitated recall by re: having pt repeat/summarize what SLP said before changing topics, and encouraged note taking on his handout. He was also encouraged to check mychart for review. SLP continued education on attention strategies re: review of active listening skills, managing fatigue, brain breaks. We discussed how these strategies are going to be important to implement when he returns to work. SLP provided relevant examples of how to use repetition/clarification in future meetings.To continue next session.   04/14/24: Pt  was seen for skilled ST services targeting cognitive-communication. Pt reports he has only had one communication breakdown in the last week., where he was unable to get his point across. Pt reports trouble with abstract divergent naming task for HEP - wife assisted with cueing at home. Pt reports he has not been reading, so he has not implemented reading strategies. Pt reports he is asking for repetition and asking for breaks in conversation which have been helpful in allowing him to express his thought before losing it. Pt reports he is having trouble with memory - provided example of a conversation this morning with his wife re: he was in the closet picking out clothes and having a conversation simultaneously with his wife - he completely forgot what he had said to his wife. SLP suspects this is likely due to divided attention. SLP reviewed active listening skills from previous session and encouraged him to remain fully ENGAGED in every conversation - making sure to eliminate all distractions. Pt reports his memory of conversations makes him  nervous about returning to work, as he has several meetings and projects he has to complete. We briefly discussed lists and prioritization of tasks. Pt asked, are there notes or something so I can remember everything we talk about? SLP suggested pt 1. Look at [employer], 2. Bring notebook/tablet/computer to take notes during therapy to assist with active engagement in conversations.   04/04/24: Pt was seen for skilled ST services targeting cognitive-communication. Pt reports he has been finding words faster, but has also experienced increased instances of anomia. Pt also reports losing train of thought - only able to hold thoughts during a conversation for around 30 seconds. He hasn't not had the opportunity to use any reading strategies in the past several days, so unable to comment on the success. SLP provided education on attention strategies, specifically active listening skills re: clarification, probing questions, paraphrasing, and summarizing. We developed a plan for conversations re: jotting thoughts down in conversation (in phone) and then asking for repetition, or talking with wife to allow more breaks in conversation to allow for pt to share thoughts before they disappear.    03/31/24: Pt was seen for skilled ST services targeting aphasia. Pt reported he had an instance of trying to find a word and was stuck. He reports he feels delay has been working to help retrieve the word faster. Pt with complaints regarding reading - SLP provided education and demonstration of reading strategies re: enlarging font, using finger/pencil to underline as he is reading, reading text aloud. Pt also reports trouble with memory. To provide memory strategies next session.   03/28/24: Pt was seen for skilled ST services targeting aphasia. Pt reports he had more word finding difficulty this weekend, but was also more fatigued as he was busy. Pt reports he  tried delay in conversation and this was not successful. He  attempted to describe and that was successful. SLP facilitated look it up during SFA task. He required minA reminders to use phone to google. Pt was able to provide 4+ features, but had some difficulty with lower frequency word finding. To cont   03/25/24: Pt was seen for skilled ST services targeting aphasia. SLP facilitated treatment by using Semantic Feature Analysis. Pt required minA to find words associated with the given object. SLP challenged pt with other word finding activities re: divergent naming in abstract categories, and convergent naming + adding of 2 items to given category. Pt required minA to complete examples. To attempt for HEP. Pt with questions about recovery, as he feels his is worsening. SLP explained this may be due to change in medicine, increased cognitive load and awareness of deficits. Pt appeared the same to SLP today. SLP encouraged pt to reach out to doctor and to go to ED if anything becomes significantly worse.   03/22/24: Pt was seen for skilled ST services targeting aphasia. Pt reports he has been having bad headaches, and standing up helps with the pain. SLP raised table and patient worked from standing today. SLP educated on semantic feature analysis (SFA) and demonstrated examples. Pt required min-modA to identify features of given object. To continue with SFA next session to continue to promote increased word finding using describe strategy.   03/17/24: Pt was seen for skilled ST services targeting aphasia. Pt reports  increased understanding and sympathy from wife in regards to communication after our therapy appointment.  Pt reports he is supposed to return to work on July 22nd, but does not feel ready. SLP encouraged pt to reach out to neurologist for further guidance. Based off deficits, SLP in agreement with postponing return to work due to aphasia and other cognitive deficits re: memory and attention. SLP educated on word finding strategies. Pt reported he would be  most likely to re: delay, synonym, look it up, gesture. To continue with strategy practice nxt session.   03/14/24: Pt was seen for skilled ST services targeting aphasia and communication strategy education. Pt was accompanied by wife and niece who report communication difficulties at home. Slp reviewed term aphasia with family using whiteboard to describe expressive vs receptive language. Then, SLP educated on communication strategies (I.e. minimizing distractions, asking for clarification, giving extra time for processing, direct communication, manage fatigue etc.), provided relevant examples, and provided handouts. Wife reports they struggle at home with re: his attention, easily becoming overwhelmed with noise, processing verbal information, and him expressing thoughts. Pt was in agreement with wife insights. To cont with word finding strategies next session.   03/08/24: Pt was seen for skilled ST services targeting continued assessment. SLP reviewed results of CLQT. Pt had most difficulty in sections requiring language re: story recall, divergent naming (concrete and abstract). Cognitive sections were WFL. Pt reports he has no difficulty understanding verbal information, but does struggle to recall verbal information which is consistent with where surgical site is located.     PATIENT EDUCATION: Education details: Aphasia, SLP role Person educated: Patient Education method: Explanation Education comprehension: needs further education   GOALS: Goals reviewed with patient? No  SHORT TERM GOALS: Target date: 04/06/24  Complete CLQT, PROMs, and update goals.  Baseline: Goal status: MET  2.  Pt will recall 3 word finding strategies to use in instances of anomia Baseline:  Goal status: INITIAL  3.  Pt will recall 2+  reading strategies to support comprehension of complex, written information Baseline:  Goal status: INITIAL  4.  Pt will recall 2 strategies to support active listening  skills in conversations  Baseline:  Goal status: INITIAL    LONG TERM GOALS: Target date: 05/07/24  Improve score on PROMs Baseline:  Goal status: INITIAL  2.  Pt/wife will report successful use of supported communication strategies for aphasia.  Baseline:  Goal status: INITIAL  3.  Pt will report successful use of active listening skills in conversations Baseline:  Goal status: INITIAL  4.  Pt will report successful use of memory strategies in recall of important information.  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 See tx note. SLP rec skilled ST services to address aphasia and cognitive-communication impairment.    OBJECTIVE IMPAIRMENTS: include memory and aphasia. These impairments are limiting patient from return to work, managing medications, managing appointments, managing finances, household responsibilities, and effectively communicating at home and in community. Factors affecting potential to achieve goals and functional outcome are NA. Patient will benefit from skilled SLP services to address above impairments and improve overall function.  REHAB POTENTIAL: Good  PLAN:  SLP FREQUENCY: 2x/week  SLP DURATION: 8 weeks  PLANNED INTERVENTIONS: Environmental controls, Cueing hierachy, Internal/external aids, Functional tasks, Multimodal communication approach, SLP instruction and feedback, Compensatory strategies, Patient/family education, and [id number] Treatment of speech (30 or 45 min)     [employer], CCC-SLP 04/20/2024, 9:34 AM

## 2024-04-21 ENCOUNTER — Encounter: Payer: Self-pay | Admitting: Speech Pathology

## 2024-04-22 ENCOUNTER — Encounter: Payer: Self-pay | Admitting: Speech Pathology

## 2024-04-26 ENCOUNTER — Ambulatory Visit: Payer: Self-pay | Admitting: Speech Pathology

## 2024-04-26 ENCOUNTER — Encounter: Payer: Self-pay | Admitting: Speech Pathology

## 2024-04-26 DIAGNOSIS — R41841 Cognitive communication deficit: Secondary | ICD-10-CM

## 2024-04-26 DIAGNOSIS — R4701 Aphasia: Secondary | ICD-10-CM

## 2024-04-26 NOTE — Therapy (Signed)
 OUTPATIENT SPEECH LANGUAGE PATHOLOGY TREATMENT   Patient Name: Robert Byrd MRN: [id number] DOB:10-May-1991, 33 y.o., male Today's Date: 04/26/2024  PCP: Cleotilde Bernardino Hutchinson, PA-C REFERRING PROVIDER: Nettie Shasta Olam Beth, PA-C  END OF SESSION:  End of Session - 04/26/24 0934     Visit Number 12    Number of Visits 17    Date for SLP Re-Evaluation 05/07/24    SLP Start Time 0932    SLP Stop Time  1015    SLP Time Calculation (min) 43 min    Activity Tolerance Patient tolerated treatment well          Past Medical History:  Diagnosis Date   Seizure (HCC) 1993   History reviewed. No pertinent surgical history. 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  Pt accompanied by: self  PERTINENT HISTORY: epilepsy, LATL surgery  PAIN:  Are you having pain? No   FALLS: Has patient fallen in last 6 months?  No  LIVING ENVIRONMENT: Lives with: lives with their family; Wife [name]) Kids, [name] and [place] Lives in: House/apartment  PLOF:  Level of assistance: Independent with ADLs, Independent with IADLs Employment: Full-time employment, On disability; Reports for [employer] (unsure of title)   PATIENT GOALS:  language  OBJECTIVE:  Note: Objective measures were completed at Evaluation unless otherwise noted.  DIAGNOSTIC FINDINGS: Limited on this EMR  COGNITION: Overall cognitive status: Impaired Areas of impairment:  Memory: Impaired: Short term Auditory Functional deficits: Needs to be further assessed in conjunction with language deficits.   AUDITORY COMPREHENSION: Overall auditory comprehension: Appears intact YES/NO questions: Appears intact Following directions: Appears intact Conversation: Complex Interfering components: working Research scientist (life sciences): extra processing time and repetition/stressing words  READING COMPREHENSION: Impaired: paragraph; reports slowed processing and taking longer to read multiple paragraphs. When assessed with single paragraph, pt scored 4/5 on comprehension questions.   EXPRESSION: verbal  VERBAL EXPRESSION: Level of generative/spontaneous verbalization: conversation Automatic speech: N/A Repetition: Appears intact @ sentence Naming: Confrontation: 51-75% and Divergent: impaired on CLQT Pragmatics: Appears intact Comments: NA Interfering components: NA Effective technique: sentence completion and descriptions Non-verbal means of communication: N/A  WRITTEN EXPRESSION: Dominant hand: right Written expression: Not tested  MOTOR SPEECH: Overall motor speech: Appears intact Level of impairment: Conversation Respiration: thoracic breathing Phonation: normal Resonance: WFL Articulation: Appears intact Intelligibility: Intelligible Motor planning: Appears intact Motor speech errors: NA Interfering components: NA Effective technique: NA  ORAL MOTOR EXAMINATION: Overall status: WFL Comments: reports reduced jaw ROM  STANDARDIZED ASSESSMENTS: Initiated CLQT - to complete next session  [employer] - Short Form: 6/15 (with pt reporting he did not actually know 2 items on this test)   PATIENT REPORTED OUTCOME MEASURES  (PROM): Communication Participation: 16/30; lower number indicating greater impact on QOL  TREATMENT DATE:   04/26/24: Pt was  seen for skilled ST services targeting cognitive-communication. Pt instances of anomia is continuing to trend downward. SLP continued education on attention strategies and provided relevant strategies for return to work. Pt reports he is not nervous about returning to work and shares that people's return to work is usually a slow progression. SLP emphasized the importance of writing tasks down, prioritizing tasks appropriately, and focusing on one task at a time due to pt's difficulty with divided attention. SLP instructed pt to take notes during future meetings to ensure he remains engaged in the conversation, as well as, not forget important items. SLP began discussing discharge with POC date ending soon.   04/20/24: Pt was seen for skilled ST services targeting cognitive-communication. He feels word finding is getting better with less frequency.  Pt forgot to bring paper/pencil/computer to take notes during session. SLP facilitated recall by re: having pt repeat/summarize what SLP said before changing topics, and encouraged note taking on his handout. He was also encouraged to check mychart for review. SLP continued education on attention strategies re: review of active listening skills, managing fatigue, brain breaks. We discussed how these strategies are going to be important to implement when he returns to work. SLP provided relevant examples of how to use repetition/clarification in future meetings.To continue next session.   04/14/24: Pt  was seen for skilled ST services targeting cognitive-communication. Pt reports he has only had one communication breakdown in the last week., where he was unable to get his point across. Pt reports trouble with abstract  divergent naming task for HEP - wife assisted with cueing at home. Pt reports he has not been reading, so he has not implemented reading strategies. Pt reports he is asking for repetition and asking for breaks in conversation which have been helpful in allowing him to express his thought before losing it. Pt reports he is having trouble with memory - provided example of a conversation this morning with his wife re: he was in the closet picking out clothes and having a conversation simultaneously with his wife - he completely forgot what he had said to his wife. SLP suspects this is likely due to divided attention. SLP reviewed active listening skills from previous session and encouraged him to remain fully ENGAGED in every conversation - making sure to eliminate all distractions. Pt reports his memory of conversations makes him nervous about returning to work, as he has several meetings and projects he has to complete. We briefly discussed lists and prioritization of tasks. Pt asked, are there notes or something so I can remember everything we talk about? SLP suggested pt 1. Look at [employer], 2. Bring notebook/tablet/computer to take notes during therapy to assist with active engagement in conversations.   04/04/24: Pt was seen for skilled ST services targeting cognitive-communication. Pt reports he has been finding words faster, but has also experienced increased instances of anomia. Pt also reports losing train of thought - only able to hold thoughts during a conversation for around 30 seconds. He hasn't not had the opportunity to use any reading strategies in the past several days, so unable to comment on the success. SLP provided education on attention strategies, specifically active listening skills re: clarification, probing questions, paraphrasing, and summarizing. We developed a plan for conversations re: jotting thoughts down in conversation (in phone) and then asking for repetition, or talking with  wife to allow more breaks in conversation to allow for pt to share thoughts before  they disappear.    03/31/24: Pt was seen for skilled ST services targeting aphasia. Pt reported he had an instance of trying to find a word and was stuck. He reports he feels delay has been working to help retrieve the word faster. Pt with complaints regarding reading - SLP provided education and demonstration of reading strategies re: enlarging font, using finger/pencil to underline as he is reading, reading text aloud. Pt also reports trouble with memory. To provide memory strategies next session.   03/28/24: Pt was seen for skilled ST services targeting aphasia. Pt reports he had more word finding difficulty this weekend, but was also more fatigued as he was busy. Pt reports he tried delay in conversation and this was not successful. He attempted to describe and that was successful. SLP facilitated look it up during SFA task. He required minA reminders to use phone to google. Pt was able to provide 4+ features, but had some difficulty with lower frequency word finding. To cont   03/25/24: Pt was seen for skilled ST services targeting aphasia. SLP facilitated treatment by using Semantic Feature Analysis. Pt required minA to find words associated with the given object. SLP challenged pt with other word finding activities re: divergent naming in abstract categories, and convergent naming + adding of 2 items to given category. Pt required minA to complete examples. To attempt for HEP. Pt with questions about recovery, as he feels his is worsening. SLP explained this may be due to change in medicine, increased cognitive load and awareness of deficits. Pt appeared the same to SLP today. SLP encouraged pt to reach out to doctor and to go to ED if anything becomes significantly worse.   03/22/24: Pt was seen for skilled ST services targeting aphasia. Pt reports he has been having bad headaches, and standing up helps with the pain.  SLP raised table and patient worked from standing today. SLP educated on semantic feature analysis (SFA) and demonstrated examples. Pt required min-modA to identify features of given object. To continue with SFA next session to continue to promote increased word finding using describe strategy.   03/17/24: Pt was seen for skilled ST services targeting aphasia. Pt reports  increased understanding and sympathy from wife in regards to communication after our therapy appointment.  Pt reports he is supposed to return to work on July 22nd, but does not feel ready. SLP encouraged pt to reach out to neurologist for further guidance. Based off deficits, SLP in agreement with postponing return to work due to aphasia and other cognitive deficits re: memory and attention. SLP educated on word finding strategies. Pt reported he would be most likely to re: delay, synonym, look it up, gesture. To continue with strategy practice nxt session.   03/14/24: Pt was seen for skilled ST services targeting aphasia and communication strategy education. Pt was accompanied by wife and niece who report communication difficulties at home. Slp reviewed term aphasia with family using whiteboard to describe expressive vs receptive language. Then, SLP educated on communication strategies (I.e. minimizing distractions, asking for clarification, giving extra time for processing, direct communication, manage fatigue etc.), provided relevant examples, and provided handouts. Wife reports they struggle at home with re: his attention, easily becoming overwhelmed with noise, processing verbal information, and him expressing thoughts. Pt was in agreement with wife insights. To cont with word finding strategies next session.   03/08/24: Pt was seen for skilled ST services targeting continued assessment. SLP reviewed results of CLQT. Pt had most difficulty in  sections requiring language re: story recall, divergent naming (concrete and abstract).  Cognitive sections were WFL. Pt reports he has no difficulty understanding verbal information, but does struggle to recall verbal information which is consistent with where surgical site is located.     PATIENT EDUCATION: Education details: Aphasia, SLP role Person educated: Patient Education method: Explanation Education comprehension: needs further education   GOALS: Goals reviewed with patient? No  SHORT TERM GOALS: Target date: 04/06/24  Complete CLQT, PROMs, and update goals.  Baseline: Goal status: MET  2.  Pt will recall 3 word finding strategies to use in instances of anomia Baseline:  Goal status: INITIAL  3.  Pt will recall 2+ reading strategies to support comprehension of complex, written information Baseline:  Goal status: INITIAL  4.  Pt will recall 2 strategies to support active listening skills in conversations  Baseline:  Goal status: INITIAL    LONG TERM GOALS: Target date: 05/07/24  Improve score on PROMs Baseline:  Goal status: INITIAL  2.  Pt/wife will report successful use of supported communication strategies for aphasia.  Baseline:  Goal status: INITIAL  3.  Pt will report successful use of active listening skills in conversations Baseline:  Goal status: INITIAL  4.  Pt will report successful use of memory strategies in recall of important information.  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 See tx note. SLP rec skilled ST services to address aphasia and cognitive-communication impairment.    OBJECTIVE IMPAIRMENTS: include memory and aphasia. These impairments are limiting patient from return to work, managing medications, managing appointments, managing finances, household responsibilities, and effectively communicating at home and in community. Factors affecting potential to achieve goals and functional outcome are NA.  Patient will benefit from skilled SLP services to address above impairments and improve overall function.  REHAB POTENTIAL: Good  PLAN:  SLP FREQUENCY: 2x/week  SLP DURATION: 8 weeks  PLANNED INTERVENTIONS: Environmental controls, Cueing hierachy, Internal/external aids, Functional tasks, Multimodal communication approach, SLP instruction and feedback, Compensatory strategies, Patient/family education, and [id number] Treatment of speech (30 or 45 min)     [employer], CCC-SLP 04/26/2024, 9:36 AM

## 2024-04-27 ENCOUNTER — Encounter: Payer: Self-pay | Admitting: Speech Pathology

## 2024-04-28 ENCOUNTER — Ambulatory Visit: Payer: Self-pay | Admitting: Speech Pathology

## 2024-05-02 ENCOUNTER — Ambulatory Visit: Admitting: Speech Pathology

## 2024-05-04 ENCOUNTER — Encounter: Payer: Self-pay | Admitting: Speech Pathology

## 2024-05-04 ENCOUNTER — Ambulatory Visit: Admitting: Speech Pathology

## 2024-05-04 DIAGNOSIS — R41841 Cognitive communication deficit: Secondary | ICD-10-CM

## 2024-05-04 DIAGNOSIS — R4701 Aphasia: Secondary | ICD-10-CM

## 2024-05-04 NOTE — Therapy (Unsigned)
 OUTPATIENT SPEECH LANGUAGE PATHOLOGY TREATMENT   Patient Name: Robert Byrd MRN: [id number] DOB:Jun 22, 1991, 33 y.o., male Today's Date: 05/04/2024  PCP: Cleotilde Bernardino Hutchinson, PA-C REFERRING PROVIDER: Nettie Shasta Olam Beth, PA-C  END OF SESSION:  End of Session - 05/04/24 0806     Visit Number 13    Number of Visits 17    Date for SLP Re-Evaluation 05/07/24    SLP Start Time 0803    SLP Stop Time  0845    SLP Time Calculation (min) 42 min    Activity Tolerance Patient tolerated treatment well          Past Medical History:  Diagnosis Date   Seizure (HCC) 1993   History reviewed. No pertinent surgical history. 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   Pt accompanied by: self  PERTINENT HISTORY: epilepsy, LATL surgery  PAIN:  Are you having pain? No   FALLS: Has patient fallen in last 6 months?  No  LIVING ENVIRONMENT: Lives with: lives with their family; Wife [name]) Kids, [name] and Bloomer Lives in: House/apartment  PLOF:  Level of assistance: Independent with ADLs, Independent with IADLs Employment: Full-time employment, On disability; Reports for [employer] (unsure of title)   PATIENT GOALS: language  OBJECTIVE:  Note: Objective measures were  completed at Evaluation unless otherwise noted.  DIAGNOSTIC FINDINGS: Limited on this EMR  COGNITION: Overall cognitive status: Impaired Areas of impairment:  Memory: Impaired: Short term Auditory Functional deficits: Needs to be further assessed in conjunction with language deficits.   AUDITORY COMPREHENSION: Overall auditory comprehension: Appears intact YES/NO questions: Appears intact Following directions: Appears intact Conversation: Complex Interfering components: working Research scientist (life sciences): extra processing time and repetition/stressing words  READING COMPREHENSION: Impaired: paragraph; reports slowed processing and taking longer to read multiple paragraphs. When assessed with single paragraph, pt scored 4/5 on comprehension questions.   EXPRESSION: verbal  VERBAL EXPRESSION: Level of generative/spontaneous verbalization: conversation Automatic speech: N/A Repetition: Appears intact @ sentence Naming: Confrontation: 51-75% and Divergent: impaired on CLQT Pragmatics: Appears intact Comments: NA Interfering components: NA Effective technique: sentence completion and descriptions Non-verbal means of communication: N/A  WRITTEN EXPRESSION: Dominant hand: right Written expression: Not tested  MOTOR SPEECH: Overall motor speech: Appears intact Level of impairment: Conversation Respiration: thoracic breathing Phonation: normal Resonance: WFL Articulation: Appears intact Intelligibility: Intelligible Motor planning: Appears intact Motor speech errors: NA Interfering components: NA Effective technique: NA  ORAL MOTOR EXAMINATION: Overall status: WFL Comments: reports reduced jaw ROM  STANDARDIZED ASSESSMENTS: Initiated CLQT - to complete next session  [employer] - Short Form: 6/15 (with pt reporting he did not actually know 2 items on this test)   PATIENT REPORTED OUTCOME MEASURES (PROM): Communication Participation: 16/30; lower number  indicating greater impact on QOL  TREATMENT DATE:   05/04/24: Pt was seen for skilled ST services targeting cognitive-communication. Pt reports instance at work where he was in a meeting with his supervisor, and she told him work tasks that he is going to be completing. Pt got to the end of the conversation and realized he had forgotten what she said. SLP asked pt what he COULD have done in that situation and pt was able to recall he should have clarified and repeated the information back. SLP also encouraged pt to get a professional notebook to carry with him throughout the office to encourage him to write information down.   04/26/24: Pt was  seen for skilled ST services targeting cognitive-communication. Pt instances of anomia is continuing to trend downward. SLP continued education on attention strategies and provided relevant strategies for return to work. Pt reports he is not nervous about returning to work and shares that people's return to work is usually a slow progression. SLP emphasized the importance of writing tasks down, prioritizing tasks appropriately, and focusing on one task at a time due to pt's difficulty with divided attention. SLP instructed pt to take notes during future meetings to ensure he remains engaged in the conversation, as well as, not forget important items. SLP began discussing discharge with POC date ending soon.   04/20/24: Pt was seen for skilled ST services targeting cognitive-communication. He feels word finding is getting better with less frequency.  Pt forgot to bring paper/pencil/computer to take notes during session. SLP facilitated recall by re: having pt repeat/summarize what SLP said before changing topics, and encouraged note taking on his handout. He was also encouraged to check mychart for review. SLP continued education on attention  strategies re: review of active listening skills, managing fatigue, brain breaks. We discussed how these strategies are going to be important to implement when he returns to work. SLP provided relevant examples of how to use repetition/clarification in future meetings.To continue next session.   04/14/24: Pt  was seen for skilled ST services targeting cognitive-communication. Pt reports he has only had one communication breakdown in the last week., where he was unable to get his point across. Pt reports trouble with abstract divergent naming task for HEP - wife assisted with cueing at home. Pt reports he has not been reading, so he has not implemented reading strategies. Pt reports he is asking for repetition and asking for breaks in conversation which have been helpful in allowing him to express his thought before losing it. Pt reports he is having trouble with memory - provided example of a conversation this morning with his wife re: he was in the closet picking out clothes and having a conversation simultaneously with his wife - he completely forgot what he had said to his wife. SLP suspects this is likely due to divided attention. SLP reviewed active listening skills from previous session and encouraged him to remain fully ENGAGED in every conversation - making sure to eliminate all distractions. Pt reports his memory of conversations makes him nervous about returning to work, as he has several meetings and projects he has to complete. We briefly discussed lists and prioritization of tasks. Pt asked, are there notes or something so I can remember everything we talk about? SLP suggested pt 1. Look at [employer], 2. Bring notebook/tablet/computer to take notes during therapy to assist with active engagement in conversations.   04/04/24: Pt was seen for skilled ST services targeting cognitive-communication. Pt reports he has been finding words faster, but has  also experienced increased instances of anomia. Pt  also reports losing train of thought - only able to hold thoughts during a conversation for around 30 seconds. He hasn't not had the opportunity to use any reading strategies in the past several days, so unable to comment on the success. SLP provided education on attention strategies, specifically active listening skills re: clarification, probing questions, paraphrasing, and summarizing. We developed a plan for conversations re: jotting thoughts down in conversation (in phone) and then asking for repetition, or talking with wife to allow more breaks in conversation to allow for pt to share thoughts before they disappear.    03/31/24: Pt was seen for skilled ST services targeting aphasia. Pt reported he had an instance of trying to find a word and was stuck. He reports he feels delay has been working to help retrieve the word faster. Pt with complaints regarding reading - SLP provided education and demonstration of reading strategies re: enlarging font, using finger/pencil to underline as he is reading, reading text aloud. Pt also reports trouble with memory. To provide memory strategies next session.   03/28/24: Pt was seen for skilled ST services targeting aphasia. Pt reports he had more word finding difficulty this weekend, but was also more fatigued as he was busy. Pt reports he tried delay in conversation and this was not successful. He attempted to describe and that was successful. SLP facilitated look it up during SFA task. He required minA reminders to use phone to google. Pt was able to provide 4+ features, but had some difficulty with lower frequency word finding. To cont   03/25/24: Pt was seen for skilled ST services targeting aphasia. SLP facilitated treatment by using Semantic Feature Analysis. Pt required minA to find words associated with the given object. SLP challenged pt with other word finding activities re: divergent naming in abstract categories, and convergent naming + adding of 2  items to given category. Pt required minA to complete examples. To attempt for HEP. Pt with questions about recovery, as he feels his is worsening. SLP explained this may be due to change in medicine, increased cognitive load and awareness of deficits. Pt appeared the same to SLP today. SLP encouraged pt to reach out to doctor and to go to ED if anything becomes significantly worse.   03/22/24: Pt was seen for skilled ST services targeting aphasia. Pt reports he has been having bad headaches, and standing up helps with the pain. SLP raised table and patient worked from standing today. SLP educated on semantic feature analysis (SFA) and demonstrated examples. Pt required min-modA to identify features of given object. To continue with SFA next session to continue to promote increased word finding using describe strategy.   03/17/24: Pt was seen for skilled ST services targeting aphasia. Pt reports  increased understanding and sympathy from wife in regards to communication after our therapy appointment.  Pt reports he is supposed to return to work on July 22nd, but does not feel ready. SLP encouraged pt to reach out to neurologist for further guidance. Based off deficits, SLP in agreement with postponing return to work due to aphasia and other cognitive deficits re: memory and attention. SLP educated on word finding strategies. Pt reported he would be most likely to re: delay, synonym, look it up, gesture. To continue with strategy practice nxt session.   03/14/24: Pt was seen for skilled ST services targeting aphasia and communication strategy education. Pt was accompanied by wife and niece who report communication difficulties  at home. Slp reviewed term aphasia with family using whiteboard to describe expressive vs receptive language. Then, SLP educated on communication strategies (I.e. minimizing distractions, asking for clarification, giving extra time for processing, direct communication, manage fatigue  etc.), provided relevant examples, and provided handouts. Wife reports they struggle at home with re: his attention, easily becoming overwhelmed with noise, processing verbal information, and him expressing thoughts. Pt was in agreement with wife insights. To cont with word finding strategies next session.   03/08/24: Pt was seen for skilled ST services targeting continued assessment. SLP reviewed results of CLQT. Pt had most difficulty in sections requiring language re: story recall, divergent naming (concrete and abstract). Cognitive sections were WFL. Pt reports he has no difficulty understanding verbal information, but does struggle to recall verbal information which is consistent with where surgical site is located.     PATIENT EDUCATION: Education details: Aphasia, SLP role Person educated: Patient Education method: Explanation Education comprehension: needs further education   GOALS: Goals reviewed with patient? No  SHORT TERM GOALS: Target date: 04/06/24  Complete CLQT, PROMs, and update goals.  Baseline: Goal status: MET  2.  Pt will recall 3 word finding strategies to use in instances of anomia Baseline:  Goal status: INITIAL  3.  Pt will recall 2+ reading strategies to support comprehension of complex, written information Baseline:  Goal status: INITIAL  4.  Pt will recall 2 strategies to support active listening skills in conversations  Baseline:  Goal status: INITIAL    LONG TERM GOALS: Target date: 05/07/24  Improve score on PROMs Baseline:  Goal status: INITIAL  2.  Pt/wife will report successful use of supported communication strategies for aphasia.  Baseline:  Goal status: INITIAL  3.  Pt will report successful use of active listening skills in conversations Baseline:  Goal status: INITIAL  4.  Pt will report successful use of memory strategies in recall of important information.  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 See tx note. SLP rec skilled ST services to address aphasia and cognitive-communication impairment.    OBJECTIVE IMPAIRMENTS: include memory and aphasia. These impairments are limiting patient from return to work, managing medications, managing appointments, managing finances, household responsibilities, and effectively communicating at home and in community. Factors affecting potential to achieve goals and functional outcome are NA. Patient will benefit from skilled SLP services to address above impairments and improve overall function.  REHAB POTENTIAL: Good  PLAN:  SLP FREQUENCY: 2x/week  SLP DURATION: 8 weeks  PLANNED INTERVENTIONS: Environmental controls, Cueing hierachy, Internal/external aids, Functional tasks, Multimodal communication approach, SLP instruction and feedback, Compensatory strategies, Patient/family education, and [id number] Treatment of speech (30 or 45 min)     [employer], CCC-SLP 05/04/2024, 8:06 AM

## 2024-05-05 ENCOUNTER — Ambulatory Visit: Admitting: Speech Pathology

## 2024-05-11 ENCOUNTER — Ambulatory Visit: Admitting: Speech Pathology

## 2024-05-12 ENCOUNTER — Other Ambulatory Visit: Payer: Self-pay | Admitting: Neurology

## 2024-05-13 ENCOUNTER — Ambulatory Visit: Admitting: Speech Pathology

## 2024-05-13 NOTE — Telephone Encounter (Signed)
 Last seen on 07/08/23 Follow up scheduled 07/11/24   Per 03/17/24 telephone encounter  Verbal discussion with Dr. Gregg and he advised to follow up with neurosurgery and neurology at [facility] since headaches have been consistent after surgery. Dr. Gregg agreed to send in Gabapentin  as needed with limited supply until can get an appointment with [facility].  Discussed recommendations with patient and he verbalized understanding. Reviewed common side effects of gabapentin .    Rx denied

## 2024-05-17 ENCOUNTER — Ambulatory Visit: Payer: Self-pay | Attending: Neurosurgery | Admitting: Speech Pathology

## 2024-05-17 ENCOUNTER — Encounter: Payer: Self-pay | Admitting: Speech Pathology

## 2024-05-17 DIAGNOSIS — R41841 Cognitive communication deficit: Secondary | ICD-10-CM | POA: Diagnosis present

## 2024-05-17 DIAGNOSIS — R4701 Aphasia: Secondary | ICD-10-CM | POA: Insufficient documentation

## 2024-05-17 NOTE — Therapy (Signed)
 OUTPATIENT SPEECH LANGUAGE PATHOLOGY TREATMENT    Patient Name: Robert Byrd MRN: [id number] DOB:[birth year], 33 y.o., male Today's Date: 05/17/2024  PCP: Cleotilde Bernardino Hutchinson, PA-C REFERRING PROVIDER: Nettie Shasta Olam Beth, PA-C  END OF SESSION:  End of Session - 05/17/24 0804     Visit Number 14    Date for SLP Re-Evaluation 06/07/24    Authorization - Visit Number 1    Authorization - Number of Visits 4    SLP Start Time 0800    SLP Stop Time  0840    SLP Time Calculation (min) 40 min    Activity Tolerance Patient tolerated treatment well          Past Medical History:  Diagnosis Date   Seizure (HCC) 1993   History reviewed. No pertinent surgical history. 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   Pt accompanied by: self  PERTINENT HISTORY: epilepsy, LATL surgery  PAIN:  Are you having pain? No   FALLS: Has patient fallen in last 6 months?  No  LIVING ENVIRONMENT: Lives with: lives with their family; Wife [name]) Kids, [name] and [place] Lives in: House/apartment  PLOF:  Level of assistance: Independent with ADLs, Independent with IADLs Employment: Full-time employment, On disability; Reports for [employer] (unsure  of title)   PATIENT GOALS: language  OBJECTIVE:  Note: Objective measures were completed at Evaluation unless otherwise noted.  DIAGNOSTIC FINDINGS: Limited on this EMR  COGNITION: Overall cognitive status: Impaired Areas of impairment:  Memory: Impaired: Short term Auditory Functional deficits: Needs to be further assessed in conjunction with language deficits.   AUDITORY COMPREHENSION: Overall auditory comprehension: Appears intact YES/NO questions: Appears intact Following directions: Appears intact Conversation: Complex Interfering components: working Research scientist (life sciences): extra processing time and repetition/stressing words  READING COMPREHENSION: Impaired: paragraph; reports slowed processing and taking longer to read multiple paragraphs. When assessed with single paragraph, pt scored 4/5 on comprehension questions.   EXPRESSION: verbal  VERBAL EXPRESSION: Level of generative/spontaneous verbalization: conversation Automatic speech: N/A Repetition: Appears intact @ sentence Naming: Confrontation: 51-75% and Divergent: impaired on CLQT Pragmatics: Appears intact Comments: NA Interfering components: NA Effective technique: sentence completion and descriptions Non-verbal means of communication: N/A  WRITTEN EXPRESSION: Dominant hand: right Written expression: Not tested  MOTOR SPEECH: Overall motor speech: Appears intact Level of impairment: Conversation Respiration: thoracic breathing Phonation: normal Resonance: WFL Articulation: Appears intact Intelligibility: Intelligible Motor planning: Appears intact Motor speech errors: NA Interfering components: NA Effective technique: NA  ORAL MOTOR EXAMINATION: Overall status: WFL Comments: reports reduced jaw ROM  STANDARDIZED ASSESSMENTS: Initiated CLQT - to complete next session  [employer] - Short Form: 6/15 (with pt reporting he did not actually know 2 items on this test)   PATIENT  REPORTED OUTCOME MEASURES (PROM): Communication Participation: 16/30; lower number indicating greater impact on QOL  TREATMENT DATE:   05/17/24: Pt was seen for skilled ST services targeting cognitive-communication. Pt reports work has been going well with communication and memory. He went on weekend get-away with friends where he felt like he used too many spoons - noticed more difficulty those days and the day following. Pt reported he tried to use re: pause when in conversation with his friends and this has been successful. He had one time where he tried to go through the alphabet - to trigger the word. He reports a little more stress communicating in a group. Report success with communication with wife at home - they are giving extended time to process and express thoughts. He reports he has been trying to implement active listening skills, but has not become routine for him yet. SLP completed education on internal memory strategies this session. Pt is to review at home and come back with any questions. To discharge next session.   05/04/24: Pt was seen for skilled ST services targeting cognitive-communication. Pt reports instance at work where he was in a meeting with his supervisor, and she told him work tasks that he is going to be completing. Pt got to the end of the conversation and realized he had forgotten what she said. SLP asked pt what he COULD have done in that situation and pt was able to recall he should have clarified and repeated the information back. SLP also encouraged pt to get a professional notebook to carry with him throughout the office to encourage him to write information down. SLP began education on internal memory strategies - did not complete. To continue with strategy education next session. SLP to recert for 4 additional visits to continue work towards  strategy implementation.   04/26/24: Pt was  seen for skilled ST services targeting cognitive-communication. Pt instances of anomia is continuing to trend downward. SLP continued education on attention strategies and provided relevant strategies for return to work. Pt reports he is not nervous about returning to work and shares that people's return to work is usually a slow progression. SLP emphasized the importance of writing tasks down, prioritizing tasks appropriately, and focusing on one task at a time due to pt's difficulty with divided attention. SLP instructed pt to take notes during future meetings to ensure he remains engaged in the conversation, as well as, not forget important items. SLP began discussing discharge with POC date ending soon.   04/20/24: Pt was seen for skilled ST services targeting cognitive-communication. He feels word finding is getting better with less frequency.  Pt forgot to bring paper/pencil/computer to take notes during session. SLP facilitated recall by re: having pt repeat/summarize what SLP said before changing topics, and encouraged note taking on his handout. He was also encouraged to check mychart for review. SLP continued education on attention strategies re: review of active listening skills, managing fatigue, brain breaks. We discussed how these strategies are going to be important to implement when he returns to work. SLP provided relevant examples of how to use repetition/clarification in future meetings.To continue next session.   04/14/24: Pt  was seen for skilled ST services targeting cognitive-communication. Pt reports he has only had one communication breakdown in the last week., where he was unable to get his point across. Pt reports trouble with abstract divergent naming task for HEP - wife assisted with cueing at home. Pt reports he has not been reading, so he has not implemented reading strategies. Pt reports he is asking for repetition and asking for  breaks  in conversation which have been helpful in allowing him to express his thought before losing it. Pt reports he is having trouble with memory - provided example of a conversation this morning with his wife re: he was in the closet picking out clothes and having a conversation simultaneously with his wife - he completely forgot what he had said to his wife. SLP suspects this is likely due to divided attention. SLP reviewed active listening skills from previous session and encouraged him to remain fully ENGAGED in every conversation - making sure to eliminate all distractions. Pt reports his memory of conversations makes him nervous about returning to work, as he has several meetings and projects he has to complete. We briefly discussed lists and prioritization of tasks. Pt asked, are there notes or something so I can remember everything we talk about? SLP suggested pt 1. Look at [employer], 2. Bring notebook/tablet/computer to take notes during therapy to assist with active engagement in conversations.   04/04/24: Pt was seen for skilled ST services targeting cognitive-communication. Pt reports he has been finding words faster, but has also experienced increased instances of anomia. Pt also reports losing train of thought - only able to hold thoughts during a conversation for around 30 seconds. He hasn't not had the opportunity to use any reading strategies in the past several days, so unable to comment on the success. SLP provided education on attention strategies, specifically active listening skills re: clarification, probing questions, paraphrasing, and summarizing. We developed a plan for conversations re: jotting thoughts down in conversation (in phone) and then asking for repetition, or talking with wife to allow more breaks in conversation to allow for pt to share thoughts before they disappear.    03/31/24: Pt was seen for skilled ST services targeting aphasia. Pt reported he had an instance of trying to  find a word and was stuck. He reports he feels delay has been working to help retrieve the word faster. Pt with complaints regarding reading - SLP provided education and demonstration of reading strategies re: enlarging font, using finger/pencil to underline as he is reading, reading text aloud. Pt also reports trouble with memory. To provide memory strategies next session.   03/28/24: Pt was seen for skilled ST services targeting aphasia. Pt reports he had more word finding difficulty this weekend, but was also more fatigued as he was busy. Pt reports he tried delay in conversation and this was not successful. He attempted to describe and that was successful. SLP facilitated look it up during SFA task. He required minA reminders to use phone to google. Pt was able to provide 4+ features, but had some difficulty with lower frequency word finding. To cont   03/25/24: Pt was seen for skilled ST services targeting aphasia. SLP facilitated treatment by using Semantic Feature Analysis. Pt required minA to find words associated with the given object. SLP challenged pt with other word finding activities re: divergent naming in abstract categories, and convergent naming + adding of 2 items to given category. Pt required minA to complete examples. To attempt for HEP. Pt with questions about recovery, as he feels his is worsening. SLP explained this may be due to change in medicine, increased cognitive load and awareness of deficits. Pt appeared the same to SLP today. SLP encouraged pt to reach out to doctor and to go to ED if anything becomes significantly worse.   03/22/24: Pt was seen for skilled ST services targeting aphasia. Pt reports he has been  having bad headaches, and standing up helps with the pain. SLP raised table and patient worked from standing today. SLP educated on semantic feature analysis (SFA) and demonstrated examples. Pt required min-modA to identify features of given object. To continue with SFA  next session to continue to promote increased word finding using describe strategy.   03/17/24: Pt was seen for skilled ST services targeting aphasia. Pt reports  increased understanding and sympathy from wife in regards to communication after our therapy appointment.  Pt reports he is supposed to return to work on July 22nd, but does not feel ready. SLP encouraged pt to reach out to neurologist for further guidance. Based off deficits, SLP in agreement with postponing return to work due to aphasia and other cognitive deficits re: memory and attention. SLP educated on word finding strategies. Pt reported he would be most likely to re: delay, synonym, look it up, gesture. To continue with strategy practice nxt session.   03/14/24: Pt was seen for skilled ST services targeting aphasia and communication strategy education. Pt was accompanied by wife and niece who report communication difficulties at home. Slp reviewed term aphasia with family using whiteboard to describe expressive vs receptive language. Then, SLP educated on communication strategies (I.e. minimizing distractions, asking for clarification, giving extra time for processing, direct communication, manage fatigue etc.), provided relevant examples, and provided handouts. Wife reports they struggle at home with re: his attention, easily becoming overwhelmed with noise, processing verbal information, and him expressing thoughts. Pt was in agreement with wife insights. To cont with word finding strategies next session.   03/08/24: Pt was seen for skilled ST services targeting continued assessment. SLP reviewed results of CLQT. Pt had most difficulty in sections requiring language re: story recall, divergent naming (concrete and abstract). Cognitive sections were WFL. Pt reports he has no difficulty understanding verbal information, but does struggle to recall verbal information which is consistent with where surgical site is located.     PATIENT  EDUCATION: Education details: Aphasia, SLP role Person educated: Patient Education method: Explanation Education comprehension: needs further education   GOALS: Goals reviewed with patient? No  SHORT TERM GOALS: Target date: 04/06/24  Complete CLQT, PROMs, and update goals.  Baseline: Goal status: MET  2.  Pt will recall 3 word finding strategies to use in instances of anomia Baseline:  Goal status: MET  3.  Pt will recall 2+ reading strategies to support comprehension of complex, written information Baseline:  Goal status: MET  4.  Pt will recall 2 strategies to support active listening skills in conversations  Baseline:  Goal status: NOT MET    LONG TERM GOALS: Target date: 05/07/24; 06/07/24  Improve score on PROMs Baseline:  Goal status: NOT MET; CONT  2.  Pt/wife will report successful use of supported communication strategies for aphasia.  Baseline:  Goal status: MET 05/17/24  3.  Pt will report successful use of active listening skills in conversations Baseline:  Goal status: NOT MET; CONT  4.  Pt will report successful use of memory strategies in recall of important information.  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 See tx note. To recert for 4 additional visits. SLP rec skilled ST services to address aphasia and cognitive-communication impairment.    OBJECTIVE IMPAIRMENTS: include memory and aphasia. These impairments are limiting patient from return to work, managing  medications, managing appointments, managing finances, household responsibilities, and effectively communicating at home and in community. Factors affecting potential to achieve goals and functional outcome are NA. Patient will benefit from skilled SLP services to address above impairments and improve overall function.  REHAB POTENTIAL: Good  PLAN:  SLP FREQUENCY:  1x/week  SLP DURATION: 4 weeks  PLANNED INTERVENTIONS: Environmental controls, Cueing hierachy, Internal/external aids, Functional tasks, Multimodal communication approach, SLP instruction and feedback, Compensatory strategies, Patient/family education, and [id number] Treatment of speech (30 or 45 min)     [employer], CCC-SLP 05/17/2024, 8:05 AM

## 2024-05-23 ENCOUNTER — Other Ambulatory Visit: Payer: Self-pay | Admitting: Neurology

## 2024-05-23 ENCOUNTER — Encounter: Payer: Self-pay | Admitting: Speech Pathology

## 2024-05-23 ENCOUNTER — Ambulatory Visit: Payer: Self-pay | Admitting: Speech Pathology

## 2024-05-23 DIAGNOSIS — G40209 Localization-related (focal) (partial) symptomatic epilepsy and epileptic syndromes with complex partial seizures, not intractable, without status epilepticus: Secondary | ICD-10-CM

## 2024-05-23 DIAGNOSIS — R4701 Aphasia: Secondary | ICD-10-CM

## 2024-05-23 DIAGNOSIS — R41841 Cognitive communication deficit: Secondary | ICD-10-CM

## 2024-05-23 NOTE — Therapy (Addendum)
 OUTPATIENT SPEECH LANGUAGE PATHOLOGY TREATMENT    Patient Name: Robert Byrd MRN: [id number] DOB:06/23/1991, 33 y.o., male Today's Date: 05/23/2024  PCP: Cleotilde Bernardino Hutchinson, PA-C REFERRING PROVIDER: Nardin, Ana Lisa Baro, PA-C  SPEECH THERAPY DISCHARGE SUMMARY  Visits from Start of Care: 15  Current functional level related to goals / functional outcomes: Met goals; needs continued focus on implementation of strategies at work/home to support cognitive changes.   Remaining deficits: Mild cognitive-linguistic deficts   Education / Equipment: Completed   Patient agrees to discharge. Patient goals were partially met. Patient is being discharged due to meeting the stated rehab goals..     END OF SESSION:  End of Session - 05/23/24 0804     Visit Number 15    Date for SLP Re-Evaluation 06/07/24    Authorization - Visit Number 2    Authorization - Number of Visits 4    SLP Start Time 0801    SLP Stop Time  0841    SLP Time Calculation (min) 40 min    Activity Tolerance Patient tolerated treatment well          Past Medical History:  Diagnosis Date   Seizure (HCC) 1993   History reviewed. No pertinent surgical history. 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 This is second full week back at work.   Pt accompanied by: self  PERTINENT HISTORY: epilepsy, LATL surgery  PAIN:  Are you having pain? Yes; headache; 6/10; has not taken any meds   FALLS: Has patient fallen in last 6 months?  No  LIVING ENVIRONMENT: Lives with: lives with their family; Wife [name]) Kids, [name] and Energy Lives in: House/apartment  PLOF:  Level of assistance: Independent with ADLs, Independent with IADLs Employment: Full-time employment, On disability; Reports for [employer] (unsure of title)   PATIENT GOALS: language  OBJECTIVE:  Note: Objective measures were completed at Evaluation unless otherwise noted.  DIAGNOSTIC FINDINGS: Limited on this EMR  COGNITION: Overall cognitive status: Impaired Areas of impairment:  Memory: Impaired: Short term Auditory Functional deficits: Needs to be further assessed in conjunction with language deficits.   AUDITORY COMPREHENSION: Overall auditory comprehension: Appears intact YES/NO questions: Appears intact Following directions: Appears intact Conversation: Complex Interfering components: working Research scientist (life sciences): extra processing time and repetition/stressing words  READING COMPREHENSION: Impaired: paragraph; reports slowed processing and taking longer to read multiple paragraphs. When assessed with single paragraph, pt scored 4/5 on comprehension questions.   EXPRESSION: verbal  VERBAL EXPRESSION: Level of generative/spontaneous verbalization: conversation Automatic speech: N/A Repetition: Appears intact @ sentence Naming: Confrontation: 51-75% and Divergent: impaired on CLQT Pragmatics: Appears intact Comments: NA Interfering components: NA Effective technique: sentence completion and descriptions Non-verbal means of communication: N/A  WRITTEN EXPRESSION: Dominant hand: right Written expression: Not tested  MOTOR SPEECH: Overall motor speech: Appears intact Level of  impairment: [employer]  Respiration: thoracic breathing Phonation: normal Resonance: WFL Articulation: Appears intact Intelligibility: Intelligible Motor planning: Appears intact Motor speech errors: NA Interfering components: NA Effective technique: NA  ORAL MOTOR EXAMINATION: Overall status: WFL Comments: reports reduced jaw ROM  STANDARDIZED ASSESSMENTS: Initiated CLQT - to complete next session  [employer] - Short Form: 6/15 (with pt reporting he did not actually know 2 items on this test)   PATIENT REPORTED OUTCOME MEASURES (PROM): Communication Participation: 16/30; lower number indicating greater impact on QOL  05/23/24: Communication Participation: 25/30; indicating improved feelings regarding communication.                                                                                                                             TREATMENT DATE:   05/23/24: Pt was seen for skilled ST services targeting discharge education. SLP educated on [facility] [employer] and demonstrated use. Provided him with handout detailing how to access free trial. SLP re-adminstered PROM - pt improved score from assessment. Pt feels comfortable with d/c at this time, and will continue to work toward implementation of strategies. He has been implementing attention and memory strategies; though not consistently. He is aware of how to return to therapy if he feels like he is continuing to struggle in the future.   05/17/24: Pt was seen for skilled ST services targeting cognitive-communication. Pt reports work has been going well with communication and memory. He went on weekend get-away with friends where he felt like he used too many spoons - noticed more difficulty those days and the day following. Pt reported he tried to use re: pause when in conversation with his friends and this has been successful. He had one time where he tried to go through the alphabet - to trigger the word. He reports a  little more stress communicating in a group. Report success with communication with wife at home - they are giving extended time to process and express thoughts. He reports he has been trying to implement active listening skills, but has not become routine for him yet. SLP completed education on internal memory strategies this session. Pt is to review at home and come back with any questions. To discharge next session.   05/04/24: Pt was seen for skilled ST services targeting cognitive-communication. Pt reports instance at work where he was in a meeting with his supervisor, and she told him work tasks that he is going to be completing. Pt got to the end of the conversation and realized he had forgotten what she said. SLP asked pt what he COULD have done in that situation and pt was able to recall he should have clarified and repeated the information back. SLP also encouraged pt to get a professional notebook to carry with him throughout the office to encourage him to write information down. SLP began education on internal memory strategies - did not complete. To continue with strategy education next session. SLP to recert for 4 additional visits to  continue work towards Catering manager.   04/26/24: Pt was  seen for skilled ST services targeting cognitive-communication. Pt instances of anomia is continuing to trend downward. SLP continued education on attention strategies and provided relevant strategies for return to work. Pt reports he is not nervous about returning to work and shares that people's return to work is usually a slow progression. SLP emphasized the importance of writing tasks down, prioritizing tasks appropriately, and focusing on one task at a time due to pt's difficulty with divided attention. SLP instructed pt to take notes during future meetings to ensure he remains engaged in the conversation, as well as, not forget important items. SLP began discussing discharge with POC date ending  soon.   04/20/24: Pt was seen for skilled ST services targeting cognitive-communication. He feels word finding is getting better with less frequency.  Pt forgot to bring paper/pencil/computer to take notes during session. SLP facilitated recall by re: having pt repeat/summarize what SLP said before changing topics, and encouraged note taking on his handout. He was also encouraged to check mychart for review. SLP continued education on attention strategies re: review of active listening skills, managing fatigue, brain breaks. We discussed how these strategies are going to be important to implement when he returns to work. SLP provided relevant examples of how to use repetition/clarification in future meetings.To continue next session.   04/14/24: Pt  was seen for skilled ST services targeting cognitive-communication. Pt reports he has only had one communication breakdown in the last week., where he was unable to get his point across. Pt reports trouble with abstract divergent naming task for HEP - wife assisted with cueing at home. Pt reports he has not been reading, so he has not implemented reading strategies. Pt reports he is asking for repetition and asking for breaks in conversation which have been helpful in allowing him to express his thought before losing it. Pt reports he is having trouble with memory - provided example of a conversation this morning with his wife re: he was in the closet picking out clothes and having a conversation simultaneously with his wife - he completely forgot what he had said to his wife. SLP suspects this is likely due to divided attention. SLP reviewed active listening skills from previous session and encouraged him to remain fully ENGAGED in every conversation - making sure to eliminate all distractions. Pt reports his memory of conversations makes him nervous about returning to work, as he has several meetings and projects he has to complete. We briefly discussed lists and  prioritization of tasks. Pt asked, are there notes or something so I can remember everything we talk about? SLP suggested pt 1. Look at [employer], 2. Bring notebook/tablet/computer to take notes during therapy to assist with active engagement in conversations.   04/04/24: Pt was seen for skilled ST services targeting cognitive-communication. Pt reports he has been finding words faster, but has also experienced increased instances of anomia. Pt also reports losing train of thought - only able to hold thoughts during a conversation for around 30 seconds. He hasn't not had the opportunity to use any reading strategies in the past several days, so unable to comment on the success. SLP provided education on attention strategies, specifically active listening skills re: clarification, probing questions, paraphrasing, and summarizing. We developed a plan for conversations re: jotting thoughts down in conversation (in phone) and then asking for repetition, or talking with wife to allow more breaks in conversation to allow for pt to  share thoughts before they disappear.    03/31/24: Pt was seen for skilled ST services targeting aphasia. Pt reported he had an instance of trying to find a word and was stuck. He reports he feels delay has been working to help retrieve the word faster. Pt with complaints regarding reading - SLP provided education and demonstration of reading strategies re: enlarging font, using finger/pencil to underline as he is reading, reading text aloud. Pt also reports trouble with memory. To provide memory strategies next session.   03/28/24: Pt was seen for skilled ST services targeting aphasia. Pt reports he had more word finding difficulty this weekend, but was also more fatigued as he was busy. Pt reports he tried delay in conversation and this was not successful. He attempted to describe and that was successful. SLP facilitated look it up during SFA task. He required minA reminders to use  phone to google. Pt was able to provide 4+ features, but had some difficulty with lower frequency word finding. To cont   03/25/24: Pt was seen for skilled ST services targeting aphasia. SLP facilitated treatment by using Semantic Feature Analysis. Pt required minA to find words associated with the given object. SLP challenged pt with other word finding activities re: divergent naming in abstract categories, and convergent naming + adding of 2 items to given category. Pt required minA to complete examples. To attempt for HEP. Pt with questions about recovery, as he feels his is worsening. SLP explained this may be due to change in medicine, increased cognitive load and awareness of deficits. Pt appeared the same to SLP today. SLP encouraged pt to reach out to doctor and to go to ED if anything becomes significantly worse.   03/22/24: Pt was seen for skilled ST services targeting aphasia. Pt reports he has been having bad headaches, and standing up helps with the pain. SLP raised table and patient worked from standing today. SLP educated on semantic feature analysis (SFA) and demonstrated examples. Pt required min-modA to identify features of given object. To continue with SFA next session to continue to promote increased word finding using describe strategy.   03/17/24: Pt was seen for skilled ST services targeting aphasia. Pt reports  increased understanding and sympathy from wife in regards to communication after our therapy appointment.  Pt reports he is supposed to return to work on July 22nd, but does not feel ready. SLP encouraged pt to reach out to neurologist for further guidance. Based off deficits, SLP in agreement with postponing return to work due to aphasia and other cognitive deficits re: memory and attention. SLP educated on word finding strategies. Pt reported he would be most likely to re: delay, synonym, look it up, gesture. To continue with strategy practice nxt session.   03/14/24: Pt was  seen for skilled ST services targeting aphasia and communication strategy education. Pt was accompanied by wife and niece who report communication difficulties at home. Slp reviewed term aphasia with family using whiteboard to describe expressive vs receptive language. Then, SLP educated on communication strategies (I.e. minimizing distractions, asking for clarification, giving extra time for processing, direct communication, manage fatigue etc.), provided relevant examples, and provided handouts. Wife reports they struggle at home with re: his attention, easily becoming overwhelmed with noise, processing verbal information, and him expressing thoughts. Pt was in agreement with wife insights. To cont with word finding strategies next session.   03/08/24: Pt was seen for skilled ST services targeting continued assessment. SLP reviewed results of CLQT. Pt  had most difficulty in sections requiring language re: story recall, divergent naming (concrete and abstract). Cognitive sections were WFL. Pt reports he has no difficulty understanding verbal information, but does struggle to recall verbal information which is consistent with where surgical site is located.     PATIENT EDUCATION: Education details: Aphasia, SLP role Person educated: Patient Education method: Explanation Education comprehension: needs further education   GOALS: Goals reviewed with patient? No  SHORT TERM GOALS: Target date: 04/06/24  Complete CLQT, PROMs, and update goals.  Baseline: Goal status: MET  2.  Pt will recall 3 word finding strategies to use in instances of anomia Baseline:  Goal status: MET  3.  Pt will recall 2+ reading strategies to support comprehension of complex, written information Baseline:  Goal status: MET  4.  Pt will recall 2 strategies to support active listening skills in conversations  Baseline:  Goal status: NOT MET    LONG TERM GOALS: Target date: 05/07/24; 06/07/24  Improve score on  PROMs Baseline:  Goal status: MET  2.  Pt/wife will report successful use of supported communication strategies for aphasia.  Baseline:  Goal status: MET 05/17/24  3.  Pt will report successful use of active listening skills in conversations Baseline:  Goal status: PARTIALLY MET  4.  Pt will report successful use of memory strategies in recall of important information.  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 See tx note. To discharge today. Pt in agreement. To return if continued difficulty with strategy implementation.    OBJECTIVE IMPAIRMENTS: include memory and aphasia. These impairments are limiting patient from return to work, managing medications, managing appointments, managing finances, household responsibilities, and effectively communicating at home and in community. Factors affecting potential to achieve goals and functional outcome are NA. Patient will benefit from skilled SLP services to address above impairments and improve overall function.  REHAB POTENTIAL: Good  PLAN:  SLP FREQUENCY: 1x/week  SLP DURATION: 4 weeks  PLANNED INTERVENTIONS: Environmental controls, Cueing hierachy, Internal/external aids, Functional tasks, Multimodal communication approach, SLP instruction and feedback, Compensatory strategies, Patient/family education, and [id number] Treatment of speech (30 or 45 min)     [employer], CCC-SLP 05/23/2024, 8:04 AM

## 2024-05-23 NOTE — Telephone Encounter (Signed)
 Requested Prescriptions   Pending Prescriptions Disp Refills   lacosamide  (VIMPAT ) 200 MG TABS tablet [Pharmacy Med Name: LACOSAMIDE  200 MG TABLET] 180 tablet     Sig: TAKE 1 TABLET BY MOUTH TWICE A DAY   Last seen 07/08/23, next appt 07/11/24  Dispenses   Dispensed Days Supply Quantity Provider Pharmacy  LACOSAMIDE  200 MG TABLET 02/15/2024 90 180 each Camara, Amadou, MD [facility] [phone] - [facility]...  LACOSAMIDE  200 MG TABLET 11/17/2023 90 180 each Gregg Lek, MD [facility] [phone] - G...  LACOSAMIDE  200 MG TABLET 08/06/2023 90 180 each Ines Onetha NOVAK, MD [facility] [phone] - [facility]

## 2024-05-26 ENCOUNTER — Encounter: Payer: Self-pay | Admitting: Speech Pathology

## 2024-05-30 ENCOUNTER — Encounter: Payer: Self-pay | Admitting: Speech Pathology

## 2024-06-03 ENCOUNTER — Other Ambulatory Visit: Payer: Self-pay

## 2024-06-03 ENCOUNTER — Ambulatory Visit
Admission: EM | Admit: 2024-06-03 | Discharge: 2024-06-03 | Disposition: A | Discharge status: Home / Self Care | Attending: Physician Assistant | Admitting: Physician Assistant

## 2024-06-03 DIAGNOSIS — I10 Essential (primary) hypertension: Secondary | ICD-10-CM | POA: Diagnosis not present

## 2024-06-03 LAB — POCT URINE DIPSTICK
Bilirubin, UA: NEGATIVE
Blood, UA: NEGATIVE
Glucose, UA: NEGATIVE mg/dL
Ketones, POC UA: NEGATIVE mg/dL
Leukocytes, UA: NEGATIVE
Nitrite, UA: NEGATIVE
POC PROTEIN,UA: NEGATIVE
Spec Grav, UA: 1.025 (ref 1.010–1.025)
Urobilinogen, UA: 0.2 U/dL
pH, UA: 7 (ref 5.0–8.0)

## 2024-06-03 MED ORDER — LOSARTAN POTASSIUM 100 MG PO TABS: 100.0000 mg | ORAL_TABLET | Freq: Every day | ORAL | 1 refills | Status: AC

## 2024-06-03 NOTE — ED Provider Notes (Signed)
 [facility]    CSN: [id number] Arrival date & time: 06/03/24  1048      History   Chief Complaint No chief complaint on file.   HPI Robert Byrd is a 33 y.o. male.   Patient is a companied by his wife who provides majority of history.  Reports that he has a history of hypertension and was being followed by his primary care who prescribed losartan  50 mg daily and hydrochlorothiazide 25 mg daily.  He has not taken either of these medications in the past month because despite regular use he did not see a difference in his blood pressure readings which were consistently above 140/90.  Earlier today he took his blood pressure and noted that this was very elevated at 173/119.  He is asymptomatic and denies any headache, dizziness, chest pain, shortness of breath, peripheral edema, visual disturbance.  He does not eat a lot of salt but denies any recent medication changes or increased caffeine consumption.    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 Authorizing Provider  losartan  (COZAAR ) 100 MG tablet Take 1 tablet (100 mg total) by mouth daily. Take half tablet (50 mg) daily for 1 week then increase to a full tablet thereafter. 06/03/24  Yes Dorion Petillo K, PA-C  carbamazepine  (TEGRETOL  XR) 200 MG 12 hr tablet TAKE 3 TABLETS (600 MG TOTAL) BY MOUTH 2 (TWO) TIMES DAILY. 3 PILLS TWICE DAILY. 03/22/24   Camara, Amadou, MD  fexofenadine (ALLEGRA) 180 MG tablet Take 180 mg by mouth daily. 03/03/22   [provider]  Fluticasone-Salmeterol (ADVAIR) 250-50 MCG/DOSE AEPB Inhale 1 puff into the lungs 2 (two) times daily.    [provider]  gabapentin  (NEURONTIN ) 300 MG capsule TAKE 1 CAPSULE BY MOUTH 3 TIMES DAILY AS NEEDED. 04/11/24   Gregg Lek, MD  lacosamide  (VIMPAT ) 200 MG TABS tablet TAKE 1 TABLET BY MOUTH TWICE A DAY 05/23/24   Gregg Lek, MD    Family History Family History  Problem Relation Age of Onset   Hypertension Maternal Grandmother    Diabetes Maternal Grandfather     Social History Social History   Tobacco Use   Smoking status: Never   Smokeless tobacco: Never  Substance Use Topics   Alcohol use: No    Alcohol/week: 0.0 standard drinks of alcohol   Drug use: No     Allergies   Patient has no known allergies.   Review of Systems Review of Systems  Constitutional:  Negative for activity change, appetite change, fatigue and fever.  Eyes:  Negative for visual disturbance.  Respiratory:  Negative for cough and shortness of breath.   Cardiovascular:  Negative for chest pain.  Gastrointestinal:  Negative for abdominal pain, diarrhea, nausea and vomiting.  Neurological:  Negative for dizziness, light-headedness and headaches.     Physical Exam Triage Vital Signs ED Triage Vitals  Encounter Vitals Group     BP 06/03/24 1137 (!) 148/98     Girls  Systolic BP Percentile --      Girls Diastolic BP Percentile --      Boys Systolic BP Percentile --      Boys Diastolic BP Percentile --      Pulse Rate 06/03/24 1137 76     Resp 06/03/24 1137 16     Temp 06/03/24 1137 97.7 F (36.5 C)     Temp Source 06/03/24 1137 Oral     SpO2 06/03/24 1137 98 %     Weight --      Height --      Head Circumference --      Peak Flow --      Pain Score 06/03/24 1135 0     Pain Loc --      Pain Education --      Exclude from Growth Chart --    No data found.  Updated Vital Signs BP (!) 148/98   Pulse 76   Temp 97.7 F (36.5 C) (Oral)   Resp 16    SpO2 98%   Visual Acuity Right Eye Distance:   Left Eye Distance:   Bilateral Distance:    Right Eye Near:   Left Eye Near:    Bilateral Near:     Physical Exam Vitals reviewed.  Constitutional:      General: He is awake.     Appearance: Normal appearance. He is well-developed. He is not ill-appearing.     Comments: Very pleasant male appears stated age in no acute distress sitting comfortable in exam room  HENT:     Head: Normocephalic and atraumatic.     Mouth/Throat:     Pharynx: No oropharyngeal exudate, posterior oropharyngeal erythema or uvula swelling.  Neck:     Vascular: No carotid bruit or JVD.  Cardiovascular:     Rate and Rhythm: Normal rate and regular rhythm.     Heart sounds: Normal heart sounds, S1 normal and S2 normal. No murmur heard. Pulmonary:     Effort: Pulmonary effort is normal.     Breath sounds: Normal breath sounds. No stridor. No wheezing, rhonchi or rales.     Comments: Clear to auscultation bilaterally Abdominal:     Palpations: Abdomen is soft.     Tenderness: There is no abdominal tenderness.  Musculoskeletal:     Right lower leg: No edema.     Left lower leg: No edema.  Neurological:     Mental Status: He is alert.  Psychiatric:        Behavior: Behavior is cooperative.      UC Treatments / Results  Labs (all labs ordered are listed, but only abnormal results are displayed) Labs Reviewed  BASIC METABOLIC PANEL WITH GFR  POCT URINE DIPSTICK    EKG   Radiology No results found.  Procedures Procedures (including critical care time)  Medications Ordered in UC Medications - No data to display  Initial Impression / Assessment and Plan / UC Course  I have reviewed the triage vital signs and the nursing notes.  Pertinent labs & imaging results that were available during my care of the patient were reviewed by me and considered in my medical decision making (see chart for details).     Patient is well-appearing, afebrile,  nontoxic, nontachycardic.  He denies any alarm symptoms that warrant emergent evaluation or imaging.  Given his significantly elevated blood pressure reading at home UA was obtained that showed no proteinuria.  BMP was obtained and we will contact him if he has  significant kidney dysfunction.  We discussed that given his very elevated blood pressure reading at home and persistently elevated readings at office visits it is important to restart antihypertensive medication.  He was interested in taking nifedipine as this is what his wife takes but we discussed that this can interact with his carbamazepine  and so instead we will use losartan .  He has previously taken this without difficulties so we will start at 100 mg but discussed that since he has not been taking it regularly recently he should start with 1/2 tablet for a week and then increase to a full tablet assuming no side effects.  He is to avoid decongestants, caffeine, sodium, NSAIDs.  We discussed that if he develops any chest pain, shortness of breath, headache, vision change, dizziness in setting of high blood pressure you should go to the emergency room immediately.  Recommended follow-up in 2 weeks significant sees primary care in this timeframe he is to return here.  If he has any worsening or changing symptoms he needs to be seen immediately.  Strict return precautions given.  All questions answered to patient and wife's satisfaction.  Final Clinical Impressions(s) / UC Diagnoses   Final diagnoses:  Elevated blood pressure reading with diagnosis of hypertension     Discharge Instructions      You are not losing any protein through your kidneys which is great news.  Your blood pressure is better controlled but we need to start a regular blood pressure medication.  Start losartan  100 mg daily.  I recommend cutting this in half for the first week to prevent side effects and then increasing to a full tablet thereafter.  Avoid decongestants,  caffeine, sodium, NSAIDs (aspirin, ibuprofen/Advil, naproxen/Aleve).  Monitor your blood pressure at home and keep a log for evaluation at follow-up appointment.  Please follow-up with your primary care within 2 weeks and if you cannot see them please return here for reevaluation and medication adjustment.  If you develop any chest pain, shortness of breath, headache, vision change, dizziness in the setting of high blood pressure need to go to the ER.     ED Prescriptions     Medication Sig Dispense Auth. Provider   losartan  (COZAAR ) 100 MG tablet Take 1 tablet (100 mg total) by mouth daily. Take half tablet (50 mg) daily for 1 week then increase to a full tablet thereafter. 30 tablet Delmo Matty K, PA-C      PDMP not reviewed this encounter.   Sherrell Rocky POUR, PA-C 06/03/24 1242

## 2024-06-03 NOTE — ED Triage Notes (Addendum)
 Pt c/o Htn 173/119 an hour ago. Pt denies HA, dizziness, blurred vision, chest pain. Pt's wife gave pt her nefedipine 30 mg at 0945 today

## 2024-06-03 NOTE — Discharge Instructions (Signed)
 You are not losing any protein through your kidneys which is great news.  Your blood pressure is better controlled but we need to start a regular blood pressure medication.  Start losartan  100 mg daily.  I recommend cutting this in half for the first week to prevent side effects and then increasing to a full tablet thereafter.  Avoid decongestants, caffeine, sodium, NSAIDs (aspirin, ibuprofen/Advil, naproxen/Aleve).  Monitor your blood pressure at home and keep a log for evaluation at follow-up appointment.  Please follow-up with your primary care within 2 weeks and if you cannot see them please return here for reevaluation and medication adjustment.  If you develop any chest pain, shortness of breath, headache, vision change, dizziness in the setting of high blood pressure need to go to the ER.

## 2024-06-04 ENCOUNTER — Ambulatory Visit: Payer: Self-pay | Admitting: Physician Assistant

## 2024-06-04 LAB — BASIC METABOLIC PANEL WITH GFR
BUN/Creatinine Ratio: 10 (ref 9–20)
BUN: 9 mg/dL (ref 6–20)
CO2: 26 mmol/L (ref 20–29)
Calcium: 10.1 mg/dL (ref 8.7–10.2)
Chloride: 104 mmol/L (ref 96–106)
Creatinine, Ser: 0.94 mg/dL (ref 0.76–1.27)
Glucose: 72 mg/dL (ref 70–99)
Potassium: 3.7 mmol/L (ref 3.5–5.2)
Sodium: 144 mmol/L (ref 134–144)
eGFR: 108 mL/min/1.73 (ref >59)

## 2024-06-06 ENCOUNTER — Encounter: Payer: Self-pay | Admitting: Speech Pathology

## 2024-06-14 ENCOUNTER — Encounter: Payer: Self-pay | Admitting: Neurology

## 2024-06-24 ENCOUNTER — Other Ambulatory Visit: Payer: Self-pay | Admitting: Neurology

## 2024-06-24 DIAGNOSIS — G40209 Localization-related (focal) (partial) symptomatic epilepsy and epileptic syndromes with complex partial seizures, not intractable, without status epilepticus: Secondary | ICD-10-CM

## 2024-07-11 ENCOUNTER — Ambulatory Visit: Payer: BC Managed Care – PPO | Admitting: Neurology

## 2024-07-11 ENCOUNTER — Encounter: Payer: Self-pay | Admitting: Neurology

## 2024-07-11 VITALS — BP 163/106 | HR 76 | Ht 73.0 in | Wt 208.0 lb

## 2024-07-11 DIAGNOSIS — Z9089 Acquired absence of other organs: Secondary | ICD-10-CM | POA: Diagnosis not present

## 2024-07-11 DIAGNOSIS — G40209 Localization-related (focal) (partial) symptomatic epilepsy and epileptic syndromes with complex partial seizures, not intractable, without status epilepticus: Secondary | ICD-10-CM | POA: Diagnosis not present

## 2024-07-11 NOTE — Progress Notes (Signed)
 PATIENT: Robert Byrd DOB: Feb 19, 1991  REASON FOR VISIT: follow up HISTORY FROM: patient  Chief Complaint  Patient presents with   RM 12    Partial symptomatic epilepsy with complex partial seizures, not intractable, without status epilepticus; denies recent seizures; with wife and sons   HISTORY OF PRESENT ILLNESS:  07/12/24   Patient presents today for follow-up, last visit was in October 2024.  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  Following his surgery, he did well no seizures and no auras.  He continue with his current antiseizure medications including Tegretol  600 mg twice daily, Vimpat  200 mg twice daily and Xcopri  200 mg daily.  He however has new complaint of headaches, this is a left-sided headache around the surgical site.  He saw a different provider and diagnosed with possible migraine versus neuropathic pain.  He is currently on gabapentin  600 mg 3 times daily but does report somnolence with the medication.   INTERVAL HISTORY 07/08/2023 Patient presents today for follow-up, last visit was in April, since then he had his EMU admission at [facility].  The first admission, they only capture 1 seizure so he had to go back a second time.  During the second admission they captured a total of 3 seizures and he is planning for epilepsy surgery hopefully early next year.  He is compliant with his medication including Tegretol , Vimpat  and Xcopri  while on this combination he has not had any additional seizures except the provoked seizure during his EMU admission.  Robert Byrd has a full neuropsychological testing done during this presurgical workup on 02/10/2023 Overall [name] neurocognitive profile is abnormal due to impairment of verbal learning and memory, as described above.  He has a very strong lateralized and localized pattern, suggestive of left (mesial) temporal dysfunction.  Etiology is likely related to an area of seizure  focus.  From a cognitive perspective, he will likely be a good surgical candidate as they will be at low risk for additional memory changes based on current findings.   INTERVAL HISTORY 01/05/2023 Patient presents today for follow-up, he is alone.  Last visit was in January.  Since then he reports that he is doing very well, no seizure or seizure-like activity.  He is pending his appointment at [facility] scheduled first week of May. He is compliant with his medications, denies any side effect   10/02/2022: AC Patient presents today for follow-up, he is accompanied by wife and 2 children.  At last visit in December, he had complained having a breakthrough seizure, this was in the setting of lack of sleep.  Wife reported she had surgery at that time and parents was doing everything and he was not getting enough sleep.  Parents report this was one of the worst seizure that he had had.  Seizure lasted about 10 minutes followed by postictal confusion about 25 minutes.  Since then he has not had any seizures.  He is compliant with the Tegretol  and Vimpat  with the addition of Xcopri .  He is interested in epilepsy surgery.   09/03/2022: Robert Byrd Robert Byrd returns for follow up for breakthrough seizures. He has continued carbamazepine  600mg  BID and lacosamide  200mg  BID. He was last seen 04/2022 reporting an event on 05/02/22. 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 Ambulatory EEG was normal. He called to report an event 12/7 where he states he started having cold chills. This lasted for  about a minute then he lost consciousness. His wife reports he had right eye deviation with stiffness of upper extremities for 1-2 minutes, R>L. Wife had surgery about a week prior and he was having to get up more at night with his children. He denies missed AED. We added Xcopri . He started titration dosing 7 days ago. He is tolerating fairly well. He has felt a little more  sleepy but also reports difficulty getting to sleep for the past two nights. He feels that he slept well last night. No chest pain or palpitations. He is eating normally. He drinks about 36-40 ounces of water daily.   05/06/2022 Robert Byrd: Robert Byrd returns for acute visit for breakthrough seizure occurring 8/18. He was driving home from the store. His wife reports that he started driving fast and felt that he was getting ready to have a seizure. He states he felt his heart start to beat harder. He did not feel chills as he usually does. He was able to get home and park car. His wife states that he spaced out and was unable to answer questions. He kept repeating I don't know. Event lasted 2-3 minutes. She was able to walk him inside. He seemed completely back to baseline after about 5 minutes. He did not seek medical attention. Denies missed doses of AED. He denies any known triggers. No lack of sleep. No stressors. No drugs or alcohol. Last dose of AED ws about 6:40am.   04/03/2022 Robert Byrd: Robert Byrd returns for follow up for seizures. He was last seen 10/2021 and reported two events that were concerning for seizure activity, last being 10/11/2021, in the setting of missed AED. EEG was normal. Carbamzepine level normal, lacosamide  level was a little low. He has continued carbamazepine  600mg  BID and lacosamide  200mg  BID. Usually takes at 7am and around 10-11pm. He is tolerating well. No seizure activity. He has been working from home.   10/30/2021 Robert Byrd: Mr Byrd returns for follow up for seizures. He continues lacosamide  200mg  and carbamazepine  600mg  BID. He denies missed doses of medications. He presents with his wife, today, who states that he had two small seizures since 09/07/2021. He reports that he felt a feeling of cold chills that came over his body. His wife reports that he did not recognize her. He was not able to respond appropriately. Wife noticed unusual movement of his tongue. He did not know his kid's names.  Another event occurred during the night of 10/11/2021. His wife woke up to him singing or talking jibberish. She felt that he was confused but not sure if he was asleep. He rolled over and went to sleep and woke up fine. He did not remember then event over night. Last dose was this morning. Wife mentions at the end of the visit that he reduced dose of lacosamide  to 150mg  BID in November after his son accidentally threw away his new prescription. He took 150mg  BID dosing for several weeks prior to resuming 200mg  BID dose.   10/29/2020 Robert Byrd:  Waddell returns today for seizure follow up. He continues lacosamide  200mg  and carbamazepine  600mg  BID. He is tolerating medicaitons well with no seizure activity. He does report more auras over the past 1-2 years. He has experienced symptoms of feeling cold Robert Byrd over and feeling anxious prior to seizures in the past. These symptoms usually get better when dose changes are made but return at some point, to varying degrees. No seizure activity has followed aura. Dose change last in 2020 with increased dose  of lacosamide  due to subtherapeutic levels. Last seizure 10/2018. He is sleeping well. No obvious changes in stress levels. His wife delivered their second son about 6 months ago. He denies missed doses of AEDs.   11/02/2019 Robert Byrd:  Lowen Barringer is a 33 y.o. male here today for follow up. He is doing well. No seizure activity. He continues to tolerate Vimpat  200mg  BID and Tegretol  XR 600mg  BID. No obvious adverse effects. He is expecting his second child as his wife is [redacted] weeks pregnant. He is feeling well today and without concerns.   04/28/2019 Robert Byrd: Butch Otterson is a 33 y.o. male here today for follow up for seizures. He reported breakthrough seizure thought to be related to increased stress at last visit. Labs show subtherapeutic Vimpat  level and dose was increased from 150mg  BID to 200mg  BID. He also takes Tegretol  XR 600mg  BID.  He reports that he is tolerating  medications well with no obvious adverse effects.  He has had no seizure activity since last being seen.  He is feeling well today and without complaints.   UPDATE 2/13/2020CM Mr. Chelf , 33 year old male returns for follow-up with history of seizure disorder he reports about 3 brief seizures since last seen in April of last year.  His dosage was changed in April and said he says it worked well for about 6 to 7 months these have occurred in the last couple of months.  One was due to a stressful situation where he had to speak in front of a group.  He is currently on Tegretol -XR 200 mg 3 tablets twice daily.  He is also on Vimpat  150 twice daily.  Denies missing any doses of his medication.  He returns for reevaluation.   UPDATE 4/11/19CM Mr. Saraceno, 33 year old male returns for follow-up with history of seizure disorder.  He reports that he has had 4-5 seizures in the last year.  He has not kept a diary, one recently when he was in the car with his wife when  she was driving he has missed some doses of his medication.  He also reports that he was on narcotics for back pain several months ago.  He has had therapeutic levels of his medication in the past.  He is currently taking Vimpat  150 twice daily and Tegretol  200 XR 3 in the morning and 2 at night.  He returns for reevaluation.  He knows he cannot drive for 6 months   UPDATE 04/09/2018CM Mr. Tiggs 33 year old male returns for follow-up of his wife. He has a history of seizure disorder starting at the age 20. He is currently on Tegretol  and Vimpat  without side effects. He denies missing any doses of his medication. In the past he has had seizures when missing doses of his medication. He returns for reevaluation. 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   UPDATE 10/09/2017CM Mr. Bayless, 34 year old male returns for follow-up he has a history of seizure disorder. He is currently on Tegretol  200 mg 2 in the morning and 3 at night along with Vimpat  150  twice daily. He ran out of his Vimpat  Saturday morning and had a seizure Sunday  Afternoon while watching football. He has not missed doses of his Tegretol . He returns for reevaluation. He states he wants to have meds increased however the important thing is take the medication as directed and do not run out of medication. He returns for reevaluation   HISTORY AA Seizures started at age 24. Complex partial and  GTCs. [facility] as a child. Dr Cleotilde did an MRI of the brain last when he was [age]. Last EEG when he was 4. Seizures started with staring. He has an aura, starts to feel cold on the inside of his body, he tries to talk a lot, he passes out. Mother provides most information. He starts salivating, starts moving his tongue around, he can't answer the simplest questions, he starts chewing a lot, staring off. Last GTCS was at age 82, denies missing dosages. He has been on tegretol  Robert Byrd his life. Then they added Vimpat . Has had >3 events in the last month, woke up after urinating in the bed. Girlfriend said he had an episode of talking and saying strange things, afterwards is tired and has to sleep. Not missing doses. Previous to this, he has been controlled ok, maybe one seizure a month. Recently there was an error in his medications, he was on 100mg  vimpat  bid and was changed to 50mg  bid and maybe that increased the seizures however was still having them once a month previously.    Also having daily pressure type headaches. Worsening. No weakness. No focal neurologic symptoms. Takes OTC medications daily.    REVIEW OF SYSTEMS: Out of a complete 14 system review of symptoms, the patient complains only of the following symptoms, auras, seizure and Robert Byrd other reviewed systems are negative.  ALLERGIES: No Known Allergies  HOME MEDICATIONS: Outpatient Medications Prior to Visit  Medication Sig Dispense Refill   albuterol (VENTOLIN HFA) 108 (90 Base) MCG/ACT inhaler Inhale 2 puffs into the lungs  every 6 (six) hours as needed.     carbamazepine  (TEGRETOL  XR) 200 MG 12 hr tablet TAKE 3 TABLETS (600 MG TOTAL) BY MOUTH 2 (TWO) TIMES DAILY. 3 PILLS TWICE DAILY. 540 tablet 0   fexofenadine (ALLEGRA) 180 MG tablet Take 180 mg by mouth daily.     Fluticasone-Salmeterol (ADVAIR) 250-50 MCG/DOSE AEPB Inhale 1 puff into the lungs 2 (two) times daily.     gabapentin  (NEURONTIN ) 300 MG capsule TAKE 1 CAPSULE BY MOUTH 3 TIMES DAILY AS NEEDED. 90 capsule 0   lacosamide  (VIMPAT ) 200 MG TABS tablet TAKE 1 TABLET BY MOUTH TWICE A DAY 180 tablet 5   losartan  (COZAAR ) 100 MG tablet Take 1 tablet (100 mg total) by mouth daily. Take half tablet (50 mg) daily for 1 week then increase to a full tablet thereafter. 30 tablet 1   Vitamin D , Ergocalciferol , (DRISDOL) 1.25 MG (50000 UNIT) CAPS capsule Take 50,000 Units by mouth once a week.     XCOPRI  200 MG TABS Take 1 tablet by mouth daily.     No facility-administered medications prior to visit.    PAST MEDICAL HISTORY: Past Medical History:  Diagnosis Date   Seizure (HCC) 1993    PAST SURGICAL HISTORY: Past Surgical History:  Procedure Laterality Date   BRAIN SURGERY      FAMILY HISTORY: Family History  Problem Relation Age of Onset   Hypertension Maternal Grandmother    Diabetes Maternal Grandfather     SOCIAL HISTORY: Social History   Socioeconomic History   Marital status: Married    Spouse name: Alfonso   Number of children: 1   Years of education: Bachelor's   Highest education level: Not on file  Occupational History   Occupation: [employer]  Tobacco Use   Smoking status: Never   Smokeless tobacco: Never  Substance and Sexual Activity   Alcohol use: No    Alcohol/week: 0.0 standard  drinks of alcohol   Drug use: No   Sexual activity: Not on file  Other Topics Concern   Not on file  Social History Narrative   Live at with home with wife, child.   Right handed.   Caffeine use: Drinks soda occassionally    Social  Drivers of Corporate Investment Banker Strain: Low Risk  (02/02/2024)   Received from [facility]   Overall Financial Resource Strain (CARDIA)    Difficulty of Paying Living Expenses: Not hard at Robert Byrd  Food Insecurity: No Food Insecurity (02/02/2024)   Received from [facility]   Hunger Vital Sign    Within the past 12 months, you worried that your food would run out before you got the money to buy more.: Never true    Within the past 12 months, the food you bought just didn't last and you didn't have money to get more.: Never true  Transportation Needs: No Transportation Needs (02/02/2024)   Received from [facility] - Transportation    In the past 12 months, has lack of transportation kept you from medical appointments or from getting medications?: No    Lack of Transportation (Non-Medical): No  Physical Activity: Not on file  Stress: Not on file  Social Connections: Not on file  Intimate Partner Violence: Not on file      PHYSICAL EXAM  Vitals:   07/11/24 1540  BP: (!) 163/106  Pulse: 76  Weight: 208 lb (94.3 kg)  Height: 6' 1 (1.854 m)     Body mass index is 27.44 kg/m.  Generalized: Well developed, in no acute distress  Cardiology: normal rate and rhythm, no murmur noted Neurological examination  Mentation: Alert oriented to time, place, history taking. Follows Robert Byrd commands speech and language fluent Cranial nerve II-XII: Pupils were equal round reactive to light. Extraocular movements were full, visual field were full on confrontational test. Facial sensation and strength were normal. Head turning and shoulder shrug  were normal and symmetric. Motor: The motor testing reveals 5 over 5 strength of Robert Byrd 4 extremities. Good symmetric motor tone is noted throughout.  Sensory: Sensory testing is intact to soft touch on Robert Byrd 4 extremities. No evidence of extinction is noted.  Coordination: Cerebellar testing  reveals good finger-nose-finger and heel-to-shin bilaterally.  Gait and station: Gait is normal.    DIAGNOSTIC DATA (LABS, IMAGING, TESTING) - I reviewed patient records, labs, notes, testing and imaging myself where available.      No data to display           Lab Results  Component Value Date   WBC 6.8 02/05/2024   HGB 11.8 (L) 02/05/2024   HCT 34.2 (L) 02/05/2024   MCV 87.2 02/05/2024   PLT 226 02/05/2024      Component Value Date/Time   NA 144 06/03/2024 1217   K 3.7 06/03/2024 1217   CL 104 06/03/2024 1217   CO2 26 06/03/2024 1217   GLUCOSE 72 06/03/2024 1217   BUN 9 06/03/2024 1217   CREATININE 0.94 06/03/2024 1217   CALCIUM 10.1 06/03/2024 1217   PROT 8.0 09/03/2022 0839   ALBUMIN 4.8 09/03/2022 0839   AST 21 09/03/2022 0839   ALT 26 09/03/2022 0839   ALKPHOS 96 09/03/2022 0839   BILITOT <0.2 09/03/2022 0839   GFRNONAA 85 10/29/2020 1612   GFRAA 99 10/29/2020 1612   No results found for: CHOL, HDL, LDLCALC, LDLDIRECT, TRIG, CHOLHDL No results found  for: HGBA1C Lab Results  Component Value Date   VITAMINB12 397 12/20/2014   No results found for: TSH   Routine EEG 2016 normal  Routine EEG 2023 normal  Ambulatory EEG 2023 normal, no seizures no events  RECORDING DATES: 06/15/2023 - 06/18/2023 Technical Description: This EEG was acquired using cable telemetry and a minimum of 16 EEG channels were used.   The patient and family were instructed to activate the push-button for any events. The EEG was reviewed for Robert Byrd events. The entire sleep period and randomly selected samples of the remainder of the tracing were reviewed. Video and audio were reviewed for push-button events and other periods of interest.   Interictal EEG: The occipital dominant rhythm was 8.5-9 Hz. This activity is reactive to stimulaiton. Present in the anterior head region is a 15-20 Hz beta activity. Drowsiness and sleep were manifested by background fragmentation,  vertex waves, K-complexes, and sleep spindles. There was no focal slowing. There were no interictal epileptiform discharges. There were no electrographic seizures identified. Photic stimulation was not performed. Hyperventilation was not performed.   Events: Event #1 (02/19, 06/16/2023): Clinical: Patient pushes the button. Feels like cold, mild oral automatism, mild slowness in response, says yes to '' had aura '' when the RN ask him. Electrographic: First ictal changes characterized by rhythmic theta slowing over the left temporal region which then evolves into sharply contoured theta over the left centro parietal and mid temporal region. Post-ictally, focal delta slowing is seen over the left hemisphere, most prominent over the left temporal region   Event 2: Clinical- Patient opens eyes, wakes up and push the button, looks around-->wide stare-->mild oral automatism-->raises up from bed --> head to Right --> forced head deviation to R -->Right hand clonic movements -->progressing to GTC-->Prominent post ictal fatigue. Electrographic --> Rhythmic theta noted in the left temporal region, which then evolves into rhythmic sharply contoured theta activity with intermixed spikes with rapid spread to the suprasylvian and infratemporal regions. This then evolves into lateralized rhythmic delta activity with intermixed spikes and with subsequent reflection over the right hemisphere and then eventually progress into bilateral hemisphere diffusely.   Summary of LTM: During the course of this hospitalization, the interictal EEG showed: a normal background. There were two event so far consisting of focal seizure characterized by feeling aura (cold sensation), subtle oral automatism, slow to respond? With EEG correlate of having rhythmic theta over left temporal region. The second event consist of starting with impaired awareness,versive right head deviation progressing to bilateral GTC with EEG correlate of left  temporal lobe onset progressing to GTC with diffuse involvement bilaterally.  Conclusion/Plan of LTM:  We will plan to discharge the patient this morning since he is back to his baseline.  Conference: The patient will need to be discussed at epilepsy conference.     MRI Brain 05/31/2022 Subtle FLAIR hyperintensity within the hippocampus on the left.  This was also observed on the MRI from 01/04/2015 and is a little less apparent on today's study.  Hippocampal volume appears symmetric.  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 No acute findings.  Normal enhancement pattern.   ASSESSMENT AND PLAN 33 y.o. year old male  has a past medical history of Seizure (HCC) (1993). here with     ICD-10-CM   1. Partial symptomatic epilepsy with complex partial seizures, not intractable, without status epilepticus (HCC)  G40.209     2. S/P lobectomy of brain  Z90.89  Doing well since his temporal lobectomy, denies any seizures or auras.  He is currently on Xcopri  150 mg daily, Vimpat  200 mg twice daily, Tegretol  XR 600 mg twice daily.  Plan will be for patient to keep him on his current medications and at the 1 year mark, in May 2026 if no seizures we will start weaning off his antiseizure medications.  Plan will be to wean him off carbamazepine  and keep him on Vimpat  and Xcopri  for now but the ultimate goal is to keep him on 1 antiseizure medication most likely Xcopri .  He has not has a seizure in the past 6 months, he may resume.  Due to his ongoing headache, we will also recommend that he continue to work from home for the next 6 months.   Patient Instructions  Continue with current antiseizure medication including Tegretol  600 mg twice daily Vimpat  200 mg twice daily Xcopri  200 mg daily For your migraines, we will give you a sample of Nurtec.  Please call for updates Continue other medication Follow-up in 46-months, if no seizure we will start weaning off  carbamazepine  Patient may resume driving  No orders of the defined types were placed in this encounter.    No orders of the defined types were placed in this encounter.   The patient's condition of temporal lobe epilepsy requires frequent monitoring and adjustments in the treatment plan, reflecting the ongoing complexity of care.  This provider is the continuing focal point for Robert Byrd needed services for this condition.    Pastor Falling, MD 07/12/2024, 8:54 AM [facility] [address], [address] [place], [place] [address] [phone]

## 2024-07-11 NOTE — Patient Instructions (Addendum)
 Continue with current antiseizure medication including Tegretol  600 mg twice daily Vimpat  200 mg twice daily Xcopri  200 mg daily For your migraines, we will give you a sample of Nurtec.  Please call for updates Continue other medication Follow-up in 42-months, if no seizure we will start weaning off carbamazepine  Patient may resume driving

## 2024-07-12 ENCOUNTER — Other Ambulatory Visit: Payer: Self-pay | Admitting: Neurology

## 2024-07-12 ENCOUNTER — Other Ambulatory Visit: Payer: Self-pay | Admitting: Medical Genetics

## 2024-07-12 MED ORDER — XCOPRI 200 MG PO TABS: 1.0000 | ORAL_TABLET | Freq: Every day | ORAL | 5 refills | Status: AC

## 2024-08-02 ENCOUNTER — Other Ambulatory Visit

## 2024-08-02 DIAGNOSIS — Z006 Encounter for examination for normal comparison and control in clinical research program: Secondary | ICD-10-CM

## 2024-08-10 ENCOUNTER — Other Ambulatory Visit (HOSPITAL_COMMUNITY): Payer: Self-pay

## 2024-08-10 ENCOUNTER — Other Ambulatory Visit: Payer: Self-pay | Admitting: Neurology

## 2024-08-10 DIAGNOSIS — G43909 Migraine, unspecified, not intractable, without status migrainosus: Secondary | ICD-10-CM

## 2024-08-10 MED ORDER — NURTEC 75 MG PO TBDP: 75.0000 mg | ORAL_TABLET | ORAL | 11 refills | Status: AC

## 2024-08-15 ENCOUNTER — Other Ambulatory Visit: Payer: Self-pay | Admitting: Neurology

## 2024-08-15 ENCOUNTER — Other Ambulatory Visit (HOSPITAL_COMMUNITY): Payer: Self-pay

## 2024-08-15 ENCOUNTER — Telehealth: Payer: Self-pay

## 2024-08-15 LAB — GENECONNECT MOLECULAR SCREEN: Genetic Analysis Overall Interpretation: NEGATIVE

## 2024-08-15 MED ORDER — CITALOPRAM HYDROBROMIDE 10 MG PO TABS: 10.0000 mg | ORAL_TABLET | Freq: Every day | ORAL | 3 refills | Status: DC

## 2024-08-15 NOTE — Telephone Encounter (Signed)
 Pharmacy Patient Advocate Encounter  Received notification from [facility] that Prior Authorization for Nurtec 75MG  dispersible tablets  has been APPROVED from 08/15/2024 to 11/13/2024. Ran test claim, Copay is $0. This test claim was processed through [facility]- copay amounts may vary at other pharmacies due to pharmacy/plan contracts, or as the patient moves through the different stages of their insurance plan.   PA #/Case ID/Reference #: [id number]

## 2024-08-15 NOTE — Telephone Encounter (Signed)
 Pharmacy Patient Advocate Encounter   Received notification from CoverMyMeds that prior authorization for Nurtec is required/requested.   Insurance verification completed.   The patient is insured through [facility].   Per test claim: PA required; PA started via CoverMyMeds. KEY [id number] . Waiting for clinical questions to populate.

## 2024-08-15 NOTE — Telephone Encounter (Signed)
 Clinical questions have been answered and PA submitted. PA currently Pending. Please be advised that most companies allow up to 30 days to make a decision. We will advise when a determination has been made, or follow up in 1 week.   Please reach out to our team, Rx Prior Auth Pool, if you haven't heard back in a week.

## 2024-08-16 ENCOUNTER — Telehealth: Payer: Self-pay | Admitting: *Deleted

## 2024-08-16 DIAGNOSIS — Z0289 Encounter for other administrative examinations: Secondary | ICD-10-CM

## 2024-08-16 NOTE — Telephone Encounter (Signed)
Pt form in nurse pod.

## 2024-08-17 NOTE — Telephone Encounter (Signed)
 Form ready for Dr Janean signature. Form in his office.

## 2024-08-22 ENCOUNTER — Telehealth: Payer: Self-pay | Admitting: *Deleted

## 2024-08-22 NOTE — Telephone Encounter (Signed)
 Pt [employer] form faxed on 08/22/2024

## 2024-08-30 ENCOUNTER — Encounter: Payer: Self-pay | Admitting: Neurology

## 2024-08-31 ENCOUNTER — Telehealth: Payer: Self-pay

## 2024-08-31 ENCOUNTER — Other Ambulatory Visit (HOSPITAL_COMMUNITY): Payer: Self-pay

## 2024-08-31 NOTE — Telephone Encounter (Signed)
 thanks

## 2024-08-31 NOTE — Telephone Encounter (Signed)
 I called [facility] per [facility]'s request, They did not have an issue running QTY 16 tabs for 30 DS, however the medication is currently out of stock and will not be delivered until tomorrow.

## 2024-09-06 ENCOUNTER — Other Ambulatory Visit: Payer: Self-pay | Admitting: Neurology

## 2024-09-24 ENCOUNTER — Other Ambulatory Visit: Payer: Self-pay | Admitting: Neurology

## 2024-09-24 DIAGNOSIS — G40209 Localization-related (focal) (partial) symptomatic epilepsy and epileptic syndromes with complex partial seizures, not intractable, without status epilepticus: Secondary | ICD-10-CM

## 2024-10-04 ENCOUNTER — Emergency Department (HOSPITAL_COMMUNITY)

## 2024-10-04 ENCOUNTER — Emergency Department (HOSPITAL_COMMUNITY)
Admission: EM | Admit: 2024-10-04 | Discharge: 2024-10-04 | Disposition: A | Discharge status: Home / Self Care | Attending: Emergency Medicine | Admitting: Emergency Medicine

## 2024-10-04 ENCOUNTER — Other Ambulatory Visit: Payer: Self-pay

## 2024-10-04 DIAGNOSIS — R519 Headache, unspecified: Secondary | ICD-10-CM | POA: Diagnosis not present

## 2024-10-04 DIAGNOSIS — R079 Chest pain, unspecified: Secondary | ICD-10-CM | POA: Diagnosis not present

## 2024-10-04 DIAGNOSIS — R42 Dizziness and giddiness: Secondary | ICD-10-CM | POA: Insufficient documentation

## 2024-10-04 DIAGNOSIS — D72819 Decreased white blood cell count, unspecified: Secondary | ICD-10-CM | POA: Diagnosis not present

## 2024-10-04 DIAGNOSIS — R0602 Shortness of breath: Secondary | ICD-10-CM | POA: Insufficient documentation

## 2024-10-04 DIAGNOSIS — R0789 Other chest pain: Secondary | ICD-10-CM

## 2024-10-04 DIAGNOSIS — T50905A Adverse effect of unspecified drugs, medicaments and biological substances, initial encounter: Secondary | ICD-10-CM

## 2024-10-04 LAB — BASIC METABOLIC PANEL WITH GFR
Anion gap: 9 (ref 5–15)
BUN: 16 mg/dL (ref 6–20)
CO2: 29 mmol/L (ref 22–32)
Calcium: 9.5 mg/dL (ref 8.9–10.3)
Chloride: 102 mmol/L (ref 98–111)
Creatinine, Ser: 1.06 mg/dL (ref 0.61–1.24)
GFR, Estimated: >60 mL/min
Glucose, Bld: 97 mg/dL (ref 70–99)
Potassium: 4.7 mmol/L (ref 3.5–5.1)
Sodium: 139 mmol/L (ref 135–145)

## 2024-10-04 LAB — CBC
HCT: 42.9 % (ref 39.0–52.0)
Hemoglobin: 14.8 g/dL (ref 13.0–17.0)
MCH: 30.6 pg (ref 26.0–34.0)
MCHC: 34.5 g/dL (ref 30.0–36.0)
MCV: 88.6 fL (ref 80.0–100.0)
Platelets: 244 K/uL (ref 150–400)
RBC: 4.84 MIL/uL (ref 4.22–5.81)
RDW: 12.1 % (ref 11.5–15.5)
WBC: 2.8 K/uL — ABNORMAL LOW (ref 4.0–10.5)
nRBC: 0 % (ref 0.0–0.2)

## 2024-10-04 LAB — TROPONIN T, HIGH SENSITIVITY
Troponin T High Sensitivity: <6 ng/L (ref 0–19)
Troponin T High Sensitivity: <6 ng/L (ref 0–19)

## 2024-10-04 NOTE — ED Triage Notes (Addendum)
 Pt. Stated, I woke up this morning and had some dizziness like the room was spinning and some chest pain, SOB, and some nausea. This lasted about 45 mionutes. Pt had similar symptoms a month ago and did not seek any Medical attention.

## 2024-10-04 NOTE — ED Provider Notes (Signed)
 " [facility]   CSN: [id number] Arrival date & time: 10/04/24  9262     Patient presents with: Dizziness, Chest Pain, Shortness of Breath, Nausea, and Headache   [facility] is a 34 y.o. male with past medical history significant for complex partial seizures, previous brain surgery, migraines who presents concern for dizziness, chest tightness, shortness of breath, nausea.  Patient reports that the chest tightness, nausea, and shortness of breath have resolved but he is still having some dizziness, mild right sided headache.  Denies any seizure-like activity.  He reports that the symptoms started as soon as he woke up.  He reports that he woke up actually with his alarm.  Reports he had a similar episode around 1 month ago but it did not last as long.  He is taking a number of antiepileptic medications and medications for headaches, he reports that he did start new medication, topiramate 2 days ago.    Dizziness Associated symptoms: chest pain, headaches and shortness of breath   Chest Pain Associated symptoms: dizziness, headache and shortness of breath   Shortness of Breath Associated symptoms: chest pain and headaches   Headache Associated symptoms: dizziness        Prior to Admission medications  Medication Sig Start Date End Date Taking? Authorizing Provider  albuterol (VENTOLIN HFA) 108 (90 Base) MCG/ACT inhaler Inhale 2 puffs into the lungs every 6 (six) hours as needed. 07/08/24   Provider, Historical, [facility]  carbamazepine  (TEGRETOL  XR) 200 MG 12 hr tablet TAKE 3 TABLETS (600 MG TOTAL) BY MOUTH 2 (TWO) TIMES DAILY. 3 PILLS TWICE DAILY. 09/26/24   Camara, Amadou, [facility]  citalopram  (CELEXA ) 10 MG tablet TAKE 1 TABLET BY MOUTH EVERY DAY 09/06/24   Camara, Amadou, [facility]  fexofenadine (ALLEGRA) 180 MG tablet Take 180 mg by mouth daily. 03/03/22   Provider, Historical, [facility]  Fluticasone-Salmeterol (ADVAIR) 250-50 MCG/DOSE AEPB Inhale 1  puff into the lungs 2 (two) times daily.    Provider, Historical, Byrd  gabapentin  (NEURONTIN ) 300 MG capsule TAKE 1 CAPSULE BY MOUTH 3 TIMES DAILY AS NEEDED. 04/11/24   [facility]  lacosamide  (VIMPAT ) 200 MG TABS tablet TAKE 1 TABLET BY MOUTH TWICE A DAY 05/23/24   [facility]  losartan  (COZAAR ) 100 MG tablet Take 1 tablet (100 mg total) by mouth daily. Take half tablet (50 mg) daily for 1 week then increase to a full tablet thereafter. 06/03/24   Raspet, Rocky POUR, PA-C  Rimegepant Sulfate (NURTEC) 75 MG TBDP Take 1 tablet (75 mg total) by mouth every other day. 08/10/24 08/29/25  Camara, Amadou, [facility]  Vitamin D , Ergocalciferol , (DRISDOL) 1.25 MG (50000 UNIT) CAPS capsule Take 50,000 Units by mouth once a week. 07/03/24   Provider, Historical, By[facility]  XCOPRI  200 MG TABS Take 1 tablet (200 mg total) by mouth daily. 07/12/24   Camara, Amadou, [facility]    Allergies: Patient has no known allergies.    Review of Systems  Respiratory:  Positive for shortness of breath.   Cardiovascular:  Positive for chest pain.  Neurological:  Positive for dizziness and headaches.  All other systems reviewed and are negative.   Updated Vital Signs BP (!) 150/104   Pulse 77   Temp 97.6 F (36.4 C)   Resp 18   Ht 6' 1 (1.854 m)   Wt 93 kg   SpO2 98%   BMI 27.05 kg/m   Physical Exam Vitals and nursing note reviewed.  Constitutional:      General: He is not in acute distress.    Appearance: Normal appearance.  HENT:     Head: Normocephalic and atraumatic.  Eyes:     General:        Right eye: No discharge.        Left eye: No discharge.  Cardiovascular:     Rate and Rhythm: Normal rate and regular rhythm.     Heart sounds: No murmur heard.    No friction rub. No gallop.  Pulmonary:     Effort: Pulmonary effort is normal.     Breath sounds: Normal breath sounds.  Abdominal:     General: Bowel sounds are normal.     Palpations: Abdomen is soft.  Skin:    General: Skin is warm and dry.      Capillary Refill: Capillary refill takes less than 2 seconds.  Neurological:     Mental Status: He is alert and oriented to person, place, and time.     Comments: Moves all 4 limbs spontaneously, CN II through XII grossly intact, can ambulate without difficulty, intact sensation throughout.  Psychiatric:        Mood and Affect: Mood normal.        Behavior: Behavior normal.     (all labs ordered are listed, but only abnormal results are displayed) Labs Reviewed - No data to display  EKG: None  Radiology: No results found.   Procedures   Medications Ordered in the ED - No data to display                                  Medical Decision Making  This patient is a 34 y.o. male  who presents to the ED for concern of dizziness, headache, chest pain.   Differential diagnoses prior to evaluation: The emergent differential diagnosis includes, but is not limited to,  ACS, AAS, PE, Mallory-Weiss, Boerhaave's, Pneumonia, acute bronchitis, asthma or COPD exacerbation, anxiety, MSK pain or traumatic injury to the chest, acid reflux versus other, BPPV, vestibular migraine, head trauma, AVM, intracranial tumor, multiple sclerosis, drug-related, CVA, orthostatic hypotension, sepsis, hypoglycemia, electrolyte disturbance, anemia, anxiety . This is not an exhaustive differential.   Past Medical History / Co-morbidities / Social History: Complex Partial seizures, previous brain surgery  Physical Exam: Physical exam performed. The pertinent findings include: Some hypertension Emergency Department, blood pressure 150/104, vital signs otherwise overall stable.  No focal neurologic deficits on exam.  No abnormal heart or lung sounds.  Lab Tests/Imaging studies: I personally interpreted labs/imaging and the pertinent results include:  CBC overall unremarkable, other than mild leukocytopenia, which has been noted previously on prior labwork. BMP unremarkable. Negative troponin x 2. Plain film chest  xray unremarkable. CT Head wo contrast with no acute change, prior lobectomy noted.. I agree with the radiologist interpretation.  Cardiac monitoring: EKG obtained and interpreted by myself and attending physician which shows: NSR, mild nonspecific ST elevation, probable early repol pattern   Medications: Encouraged following up with neurologist about topamax, no acute abnormality appreciated today   Disposition: After consideration of the diagnostic results and the patients response to treatment, I feel that patient stable for discharge, encourage close neurology follow-up.   emergency department workup does not suggest an emergent condition requiring admission or immediate intervention beyond what has been performed at this time. The plan is: as above. The patient is safe for discharge and  has been instructed to return immediately for worsening symptoms, change in symptoms or any other concerns.   Final diagnoses:  None    ED Discharge Orders     None          Rosan Sherlean VEAR DEVONNA 10/04/24 1407    Patsey Lot, [facility] 10/04/24 1527  "

## 2024-10-04 NOTE — Discharge Instructions (Signed)
 Your workup today was overall reassuring, I do not see any evidence of heart attack, stroke, or other worrisome heart or lung condition to explain the symptoms that you are describing.  Given that you recently started the new topiramate medication I am somewhat suspicious that you may be having a reaction to this as it can cause some dizziness, sedation, and in general has a fairly significant side effect profile.  Please reach out to your neurologist/primary care doctor to discuss whether or not they want you to continue on this medication.  Please return if you have significant worsening chest pain, shortness of breath in the meantime.

## 2024-10-11 ENCOUNTER — Encounter: Payer: Self-pay | Admitting: Neurology

## 2024-10-12 NOTE — Telephone Encounter (Signed)
Please add patient to the cancellation list. Thank you

## 2024-10-12 NOTE — Telephone Encounter (Signed)
Pt is on the wait list  ?

## 2025-02-09 ENCOUNTER — Ambulatory Visit: Admitting: Neurology
